# Patient Record
Sex: Female | Born: 1950 | Race: White | Hispanic: No | Marital: Married | Smoking: Never smoker
Health system: Southern US, Community
[De-identification: ages and names within clinical notes are randomized; demographics above are authoritative.]

## PROBLEM LIST (undated history)

## (undated) DIAGNOSIS — Z789 Other specified health status: Secondary | ICD-10-CM

## (undated) HISTORY — PX: DIAGNOSTIC LAPAROSCOPY: SUR761

## (undated) HISTORY — PX: BREAST SURGERY: SHX581

## (undated) HISTORY — PX: DILATION AND CURETTAGE OF UTERUS: SHX78

---

## 2000-01-01 ENCOUNTER — Other Ambulatory Visit: Admission: RE | Admit: 2000-01-01 | Discharge: 2000-01-01 | Payer: Self-pay | Admitting: Obstetrics and Gynecology

## 2000-02-21 ENCOUNTER — Ambulatory Visit (HOSPITAL_COMMUNITY): Admission: RE | Admit: 2000-02-21 | Discharge: 2000-02-21 | Payer: Self-pay | Admitting: Obstetrics and Gynecology

## 2000-11-17 ENCOUNTER — Encounter (INDEPENDENT_AMBULATORY_CARE_PROVIDER_SITE_OTHER): Payer: Self-pay | Admitting: Specialist

## 2000-11-17 ENCOUNTER — Other Ambulatory Visit: Admission: RE | Admit: 2000-11-17 | Discharge: 2000-11-17 | Payer: Self-pay | Admitting: Obstetrics and Gynecology

## 2001-01-02 ENCOUNTER — Ambulatory Visit (HOSPITAL_COMMUNITY): Admission: RE | Admit: 2001-01-02 | Discharge: 2001-01-02 | Payer: Self-pay | Admitting: Family Medicine

## 2001-01-02 ENCOUNTER — Encounter: Payer: Self-pay | Admitting: Family Medicine

## 2001-01-06 ENCOUNTER — Other Ambulatory Visit: Admission: RE | Admit: 2001-01-06 | Discharge: 2001-01-06 | Payer: Self-pay | Admitting: Obstetrics and Gynecology

## 2002-01-11 ENCOUNTER — Other Ambulatory Visit: Admission: RE | Admit: 2002-01-11 | Discharge: 2002-01-11 | Payer: Self-pay | Admitting: Obstetrics and Gynecology

## 2003-01-24 ENCOUNTER — Other Ambulatory Visit: Admission: RE | Admit: 2003-01-24 | Discharge: 2003-01-24 | Payer: Self-pay | Admitting: Obstetrics and Gynecology

## 2003-02-03 ENCOUNTER — Encounter: Admission: RE | Admit: 2003-02-03 | Discharge: 2003-02-03 | Payer: Self-pay | Admitting: Obstetrics and Gynecology

## 2003-02-03 ENCOUNTER — Encounter: Payer: Self-pay | Admitting: Obstetrics and Gynecology

## 2004-02-14 ENCOUNTER — Other Ambulatory Visit: Admission: RE | Admit: 2004-02-14 | Discharge: 2004-02-14 | Payer: Self-pay | Admitting: Obstetrics and Gynecology

## 2004-02-21 ENCOUNTER — Encounter: Admission: RE | Admit: 2004-02-21 | Discharge: 2004-02-21 | Payer: Self-pay | Admitting: Obstetrics and Gynecology

## 2004-05-23 ENCOUNTER — Encounter (INDEPENDENT_AMBULATORY_CARE_PROVIDER_SITE_OTHER): Payer: Self-pay | Admitting: *Deleted

## 2004-05-23 ENCOUNTER — Ambulatory Visit (HOSPITAL_BASED_OUTPATIENT_CLINIC_OR_DEPARTMENT_OTHER): Admission: RE | Admit: 2004-05-23 | Discharge: 2004-05-23 | Payer: Self-pay | Admitting: Obstetrics and Gynecology

## 2004-05-23 ENCOUNTER — Ambulatory Visit (HOSPITAL_COMMUNITY): Admission: RE | Admit: 2004-05-23 | Discharge: 2004-05-23 | Payer: Self-pay | Admitting: Obstetrics and Gynecology

## 2005-02-21 ENCOUNTER — Encounter: Admission: RE | Admit: 2005-02-21 | Discharge: 2005-02-21 | Payer: Self-pay | Admitting: Obstetrics and Gynecology

## 2006-02-25 ENCOUNTER — Encounter: Admission: RE | Admit: 2006-02-25 | Discharge: 2006-02-25 | Payer: Self-pay | Admitting: Obstetrics and Gynecology

## 2007-03-17 ENCOUNTER — Encounter: Admission: RE | Admit: 2007-03-17 | Discharge: 2007-03-17 | Payer: Self-pay | Admitting: Obstetrics and Gynecology

## 2007-03-26 ENCOUNTER — Encounter: Admission: RE | Admit: 2007-03-26 | Discharge: 2007-03-26 | Payer: Self-pay | Admitting: Obstetrics and Gynecology

## 2008-03-17 ENCOUNTER — Encounter: Admission: RE | Admit: 2008-03-17 | Discharge: 2008-03-17 | Payer: Self-pay | Admitting: Obstetrics and Gynecology

## 2008-03-31 ENCOUNTER — Encounter: Admission: RE | Admit: 2008-03-31 | Discharge: 2008-03-31 | Payer: Self-pay | Admitting: Obstetrics and Gynecology

## 2008-10-12 ENCOUNTER — Encounter: Admission: RE | Admit: 2008-10-12 | Discharge: 2008-10-12 | Payer: Self-pay | Admitting: Obstetrics and Gynecology

## 2009-03-23 ENCOUNTER — Encounter: Admission: RE | Admit: 2009-03-23 | Discharge: 2009-03-23 | Payer: Self-pay | Admitting: Obstetrics and Gynecology

## 2010-03-27 ENCOUNTER — Encounter: Admission: RE | Admit: 2010-03-27 | Discharge: 2010-03-27 | Payer: Self-pay | Admitting: Family Medicine

## 2011-01-24 ENCOUNTER — Other Ambulatory Visit: Payer: Self-pay | Admitting: Dermatology

## 2011-03-11 ENCOUNTER — Other Ambulatory Visit: Payer: Self-pay | Admitting: Obstetrics and Gynecology

## 2011-03-11 DIAGNOSIS — Z1231 Encounter for screening mammogram for malignant neoplasm of breast: Secondary | ICD-10-CM

## 2011-03-29 ENCOUNTER — Ambulatory Visit
Admission: RE | Admit: 2011-03-29 | Discharge: 2011-03-29 | Disposition: A | Discharge status: Home / Self Care | Payer: PRIVATE HEALTH INSURANCE | Source: Ambulatory Visit | Attending: Obstetrics and Gynecology | Admitting: Obstetrics and Gynecology

## 2011-03-29 DIAGNOSIS — Z1231 Encounter for screening mammogram for malignant neoplasm of breast: Secondary | ICD-10-CM

## 2011-05-03 NOTE — Op Note (Signed)
Lisa Oliver, Lisa Oliver                          ACCOUNT NO.:  [id number]   MEDICAL RECORD NO.:  [id number]                   PATIENT TYPE:  AMB   LOCATION:  [facility]                                 FACILITY:  [facility]   PHYSICIAN:  Sherry A. Rosalio Macadamia, M.D.           DATE OF BIRTH:  September 27, 1951   DATE OF PROCEDURE:  05/23/2004  DATE OF DISCHARGE:                                 OPERATIVE REPORT   PREOPERATIVE DIAGNOSES:  1. Postmenopausal bleeding.  2. Fibroid uterus.  3. Endometrial calcifications.   POSTOPERATIVE DIAGNOSES:  1. Postmenopausal bleeding.  2. Fibroid uterus.  3. Endometrial calcifications.  4. Midline septum.   PROCEDURE:  D&C hysteroscopy with resectoscope.   SURGEON:  Dr. Rosalio Macadamia   ANESTHESIA:  MAC.   INDICATIONS:  This is a 60 year old, G0, P0 woman, who has had persistent  postmenopausal bleeding.  This has been intermittent over several years.  It  would be present in small amounts frequently and then stopped for several  months and restart.  Because of this, an ultrasound was performed which  showed calcifications at the fundus of the uterus as well as multiple  fibroids within the uterus, one fibroid being at the fundus of the uterus  near the endometrial cavity and a hyperechoic area shadowing the cervix.  Because of this, the patient is brought to the operating room for [facility]  hysteroscopy with resectoscope.   FINDINGS:  Normal size, anteflexed uterus with endocervical polyp, midline  broad-based septum with some mucosal fibroid present next to the septum.   DESCRIPTION OF PROCEDURE:  The patient is brought into the operating room  and given adequate IV sedation.  She is placed in a dorsal lithotomy  position.  Her perineum was washed with Hibiclens.  Pelvic examination was  performed.  The patient was draped in a sterile fashion.  Speculum was  placed within the vagina.  Vagina was washed with Hibiclens.  Paracervical  block was administered with 1%  Nesacaine.  Anterior lip of the cervix was  grasped with a single-tooth tenaculum.  Cervix was sounded; cervix was  dilated with the [facility] dilators to a #31.  Hysteroscope was introduced into  the cervical canal.  Polypoid tissue was seen in the cervical canal, and  this was excised using the double-loop resector.  The cervix needed to be  dilated slightly more after this was removed, so the hysteroscope was  removed, speculum re-placed, and further dilatation performed.  The  hysteroscope was then re-placed, and visualization of the endometrial cavity  was then very easy to obtain.  The broad-base septum was difficult to  determine whether this was really separate from the submucosal fibroid.  This area was resected with obvious submucosal fibroid present.  The  submucosal fibroid was resected in sheets.  Endometrial tissue was resected  circumferentially from the endometrial cavity.  Small bleeders were  cauterized.  Pictures were obtained before and after surgery.  All  instruments were removed from the vagina after adequate hemostasis was  present.  The patient was taken out of the dorsal lithotomy position.  She  was awakened.  She was moved from the operating table to a stretcher in  stable condition.  Complications were none.  Estimated blood loss less than  10 mL.  Sorbitol differential minus 100 mL.   SPECIMENS:  1. Cervical polyp.  2. Endometrial resections.  3. Submucosal fibroid.                                               Sherry A. Rosalio Macadamia, M.D.    SAD/MEDQ  D:  05/23/2004  T:  05/23/2004  Job:  [id number]

## 2012-02-21 ENCOUNTER — Other Ambulatory Visit: Payer: Self-pay | Admitting: Obstetrics & Gynecology

## 2012-02-21 DIAGNOSIS — Z1231 Encounter for screening mammogram for malignant neoplasm of breast: Secondary | ICD-10-CM

## 2012-04-08 ENCOUNTER — Ambulatory Visit
Admission: RE | Admit: 2012-04-08 | Discharge: 2012-04-08 | Disposition: A | Discharge status: Home / Self Care | Payer: PRIVATE HEALTH INSURANCE | Source: Ambulatory Visit | Attending: Obstetrics & Gynecology | Admitting: Obstetrics & Gynecology

## 2012-04-08 DIAGNOSIS — Z1231 Encounter for screening mammogram for malignant neoplasm of breast: Secondary | ICD-10-CM

## 2013-03-01 ENCOUNTER — Other Ambulatory Visit: Payer: Self-pay

## 2013-04-09 ENCOUNTER — Ambulatory Visit
Admission: RE | Admit: 2013-04-09 | Discharge: 2013-04-09 | Disposition: A | Discharge status: Home / Self Care | Payer: PRIVATE HEALTH INSURANCE | Source: Ambulatory Visit

## 2013-04-09 DIAGNOSIS — Z1231 Encounter for screening mammogram for malignant neoplasm of breast: Secondary | ICD-10-CM

## 2013-11-02 ENCOUNTER — Other Ambulatory Visit: Payer: Self-pay | Admitting: Obstetrics & Gynecology

## 2013-11-02 DIAGNOSIS — N951 Menopausal and female climacteric states: Secondary | ICD-10-CM

## 2013-12-02 ENCOUNTER — Ambulatory Visit
Admission: RE | Admit: 2013-12-02 | Discharge: 2013-12-02 | Disposition: A | Discharge status: Home / Self Care | Payer: BC Managed Care – PPO | Source: Ambulatory Visit | Attending: Obstetrics & Gynecology | Admitting: Obstetrics & Gynecology

## 2013-12-02 DIAGNOSIS — N951 Menopausal and female climacteric states: Secondary | ICD-10-CM

## 2014-03-21 ENCOUNTER — Other Ambulatory Visit: Payer: Self-pay

## 2014-03-21 DIAGNOSIS — Z1231 Encounter for screening mammogram for malignant neoplasm of breast: Secondary | ICD-10-CM

## 2014-04-12 ENCOUNTER — Ambulatory Visit
Admission: RE | Admit: 2014-04-12 | Discharge: 2014-04-12 | Disposition: A | Discharge status: Home / Self Care | Payer: BC Managed Care – PPO | Source: Ambulatory Visit

## 2014-04-12 ENCOUNTER — Encounter (INDEPENDENT_AMBULATORY_CARE_PROVIDER_SITE_OTHER): Payer: Self-pay

## 2014-04-12 DIAGNOSIS — Z1231 Encounter for screening mammogram for malignant neoplasm of breast: Secondary | ICD-10-CM

## 2014-04-20 ENCOUNTER — Encounter: Payer: Self-pay | Admitting: Podiatry

## 2014-04-20 ENCOUNTER — Ambulatory Visit: Payer: BC Managed Care – PPO | Admitting: Podiatry

## 2014-04-20 VITALS — BP 117/73 | HR 73 | Resp 16

## 2014-04-20 DIAGNOSIS — M898X9 Other specified disorders of bone, unspecified site: Secondary | ICD-10-CM

## 2014-04-20 DIAGNOSIS — L600 Ingrowing nail: Secondary | ICD-10-CM

## 2014-04-20 NOTE — Progress Notes (Signed)
   Subjective:    Patient ID: Lisa Oliver, female    DOB: May 26, 1951, 63 y.o.   MRN: [id number]  HPI Comments: "I have this spot on the toe that is sore"  Patient c/o tender 5th toe right, lateral border, for several years. The toenail is split and the area is callused. She usually trims the corner and feels better. This time it got infected. Currently on cephalexin Rx'd by PCP.  Toe Pain       Review of Systems  All other systems reviewed and are negative.      Objective:   Physical Exam        Assessment & Plan:

## 2014-04-20 NOTE — Patient Instructions (Signed)

## 2014-04-21 NOTE — Progress Notes (Signed)
Subjective:     Patient ID: Lisa Oliver, female   DOB: 04/02/1951, 63 y.o.   MRN: [id number]  Toe Pain    patient presents with a painful fifth toe right foot outside stating that the nail has been split for years and has become increasingly tender over the last several months   Review of Systems  All other systems reviewed and are negative.      Objective:   Physical Exam  Nursing note and vitals reviewed. Constitutional: She is oriented to person, place, and time.  Cardiovascular: Intact distal pulses.   Musculoskeletal: Normal range of motion.  Neurological: She is oriented to person, place, and time.  Skin: Skin is warm.   neurovascular status is found to be intact with muscle strength adequate and range of motion of the subtalar and midtarsal joint within normal limits. Patient is found to have a rotated fifth toe right with a distal lateral side it's inflamed and sore with either nail disease or possibility of nail disease and keratotic lesion formation     Assessment:     Ingrown toenail deformity with possibility also for exostotic lesion secondary to digital position    Plan:     Educated patient on this and today we will focus on the nail. Infiltrated 60 mg Xylocaine Marcaine mixture remove the lateral border exposed matrix and apply chemical phenol 3 applications 30 seconds followed by alcohol lavaged and sterile dressing. Given instructions on soaks and if symptoms persist or lesion forms we will need to consider derotational arthroplasty which I explained

## 2014-04-25 ENCOUNTER — Telehealth: Payer: Self-pay | Admitting: *Deleted

## 2014-04-25 NOTE — Telephone Encounter (Signed)
I saw him last week for an ingrown toenail.  Dr. Paulla Dolly told me to soak it about 1 week.  What do I do next?  Leave a message, I may be at the vet taking care of a sick dog.  I attempted to return her call, line was busy

## 2014-04-27 NOTE — Telephone Encounter (Signed)
Patient called again today.  I returned her call and informed her she needs to do soaks as long as she has drainage as instructed.  I told her she can let it air out at night while relaxing but cover it back up when going to bed.  She asked how will she know if it's draining.  I told her to check her bandages, if nothing there can discontinue the soaks and dressings.  She said it's still a little swollen is this normal.  I informed her yes, it's normal.

## 2014-12-21 ENCOUNTER — Other Ambulatory Visit: Payer: Self-pay | Admitting: Obstetrics & Gynecology

## 2014-12-21 ENCOUNTER — Other Ambulatory Visit (HOSPITAL_COMMUNITY)
Admission: RE | Admit: 2014-12-21 | Discharge: 2014-12-21 | Disposition: A | Discharge status: Home / Self Care | Payer: Medicare Other | Source: Ambulatory Visit | Attending: Obstetrics & Gynecology | Admitting: Obstetrics & Gynecology

## 2014-12-21 DIAGNOSIS — Z01419 Encounter for gynecological examination (general) (routine) without abnormal findings: Secondary | ICD-10-CM | POA: Insufficient documentation

## 2014-12-21 DIAGNOSIS — Z1151 Encounter for screening for human papillomavirus (HPV): Secondary | ICD-10-CM | POA: Insufficient documentation

## 2014-12-23 LAB — CYTOLOGY - PAP

## 2015-01-12 ENCOUNTER — Other Ambulatory Visit: Payer: Self-pay | Admitting: Dermatology

## 2015-03-03 ENCOUNTER — Other Ambulatory Visit: Payer: Self-pay

## 2015-03-03 DIAGNOSIS — Z1239 Encounter for other screening for malignant neoplasm of breast: Secondary | ICD-10-CM

## 2015-04-18 ENCOUNTER — Ambulatory Visit
Admission: RE | Admit: 2015-04-18 | Discharge: 2015-04-18 | Disposition: A | Discharge status: Home / Self Care | Payer: Medicare Other | Source: Ambulatory Visit

## 2015-04-18 ENCOUNTER — Encounter (INDEPENDENT_AMBULATORY_CARE_PROVIDER_SITE_OTHER): Payer: Self-pay

## 2015-04-18 DIAGNOSIS — Z1239 Encounter for other screening for malignant neoplasm of breast: Secondary | ICD-10-CM

## 2016-02-22 ENCOUNTER — Other Ambulatory Visit: Payer: Self-pay

## 2016-02-22 DIAGNOSIS — Z1231 Encounter for screening mammogram for malignant neoplasm of breast: Secondary | ICD-10-CM

## 2016-04-23 ENCOUNTER — Ambulatory Visit: Payer: Medicare Other

## 2016-05-02 ENCOUNTER — Ambulatory Visit
Admission: RE | Admit: 2016-05-02 | Discharge: 2016-05-02 | Disposition: A | Discharge status: Home / Self Care | Payer: Medicare Other | Source: Ambulatory Visit

## 2016-05-02 DIAGNOSIS — Z1231 Encounter for screening mammogram for malignant neoplasm of breast: Secondary | ICD-10-CM

## 2017-01-09 ENCOUNTER — Other Ambulatory Visit: Payer: Self-pay | Admitting: Obstetrics & Gynecology

## 2017-01-09 DIAGNOSIS — Z1231 Encounter for screening mammogram for malignant neoplasm of breast: Secondary | ICD-10-CM

## 2017-01-17 ENCOUNTER — Other Ambulatory Visit: Payer: Self-pay | Admitting: Obstetrics & Gynecology

## 2017-01-17 DIAGNOSIS — N6310 Unspecified lump in the right breast, unspecified quadrant: Secondary | ICD-10-CM

## 2017-01-21 ENCOUNTER — Other Ambulatory Visit: Payer: Medicare Other

## 2017-01-27 ENCOUNTER — Ambulatory Visit
Admission: RE | Admit: 2017-01-27 | Discharge: 2017-01-27 | Disposition: A | Discharge status: Home / Self Care | Payer: Medicare Other | Source: Ambulatory Visit | Attending: Obstetrics & Gynecology | Admitting: Obstetrics & Gynecology

## 2017-01-27 ENCOUNTER — Other Ambulatory Visit: Payer: Self-pay | Admitting: Obstetrics & Gynecology

## 2017-01-27 DIAGNOSIS — N6310 Unspecified lump in the right breast, unspecified quadrant: Secondary | ICD-10-CM

## 2017-03-21 ENCOUNTER — Other Ambulatory Visit: Payer: Self-pay | Admitting: Obstetrics & Gynecology

## 2017-03-21 DIAGNOSIS — N63 Unspecified lump in unspecified breast: Secondary | ICD-10-CM

## 2017-05-07 ENCOUNTER — Ambulatory Visit: Payer: Medicare Other

## 2017-07-29 ENCOUNTER — Ambulatory Visit
Admission: RE | Admit: 2017-07-29 | Discharge: 2017-07-29 | Disposition: A | Discharge status: Home / Self Care | Payer: Medicare Other | Source: Ambulatory Visit | Attending: Obstetrics & Gynecology | Admitting: Obstetrics & Gynecology

## 2017-07-29 ENCOUNTER — Ambulatory Visit: Payer: Medicare Other

## 2017-07-29 DIAGNOSIS — N63 Unspecified lump in unspecified breast: Secondary | ICD-10-CM

## 2018-07-13 ENCOUNTER — Other Ambulatory Visit: Payer: Self-pay | Admitting: Family Medicine

## 2018-07-13 DIAGNOSIS — Z1231 Encounter for screening mammogram for malignant neoplasm of breast: Secondary | ICD-10-CM

## 2018-08-06 ENCOUNTER — Ambulatory Visit
Admission: RE | Admit: 2018-08-06 | Discharge: 2018-08-06 | Disposition: A | Discharge status: Home / Self Care | Payer: Medicare Other | Source: Ambulatory Visit | Attending: Family Medicine | Admitting: Family Medicine

## 2018-08-06 DIAGNOSIS — Z1231 Encounter for screening mammogram for malignant neoplasm of breast: Secondary | ICD-10-CM

## 2018-08-07 ENCOUNTER — Other Ambulatory Visit: Payer: Self-pay | Admitting: Family Medicine

## 2018-08-07 DIAGNOSIS — R928 Other abnormal and inconclusive findings on diagnostic imaging of breast: Secondary | ICD-10-CM

## 2018-08-12 ENCOUNTER — Other Ambulatory Visit: Payer: Self-pay | Admitting: Family Medicine

## 2018-08-12 ENCOUNTER — Ambulatory Visit
Admission: RE | Admit: 2018-08-12 | Discharge: 2018-08-12 | Disposition: A | Discharge status: Home / Self Care | Payer: Medicare Other | Source: Ambulatory Visit | Attending: Family Medicine | Admitting: Family Medicine

## 2018-08-12 DIAGNOSIS — N6489 Other specified disorders of breast: Secondary | ICD-10-CM

## 2018-08-12 DIAGNOSIS — R928 Other abnormal and inconclusive findings on diagnostic imaging of breast: Secondary | ICD-10-CM

## 2018-08-13 ENCOUNTER — Ambulatory Visit
Admission: RE | Admit: 2018-08-13 | Discharge: 2018-08-13 | Disposition: A | Discharge status: Home / Self Care | Payer: Medicare Other | Source: Ambulatory Visit | Attending: Family Medicine | Admitting: Family Medicine

## 2018-08-13 DIAGNOSIS — N6489 Other specified disorders of breast: Secondary | ICD-10-CM

## 2018-08-16 HISTORY — PX: BREAST BIOPSY: SHX20

## 2018-08-25 ENCOUNTER — Ambulatory Visit: Payer: Self-pay | Admitting: Surgery

## 2018-08-25 ENCOUNTER — Other Ambulatory Visit: Payer: Self-pay | Admitting: Surgery

## 2018-08-25 DIAGNOSIS — N6320 Unspecified lump in the left breast, unspecified quadrant: Secondary | ICD-10-CM

## 2018-08-25 NOTE — H&P (Signed)
Lisa Oliver Documented: 08/25/2018 10:29 AM Location: [facility] Patient #: [id number] DOB: 10/24/1951 Married / Language: Lisa Oliver / Race: White Female  History of Present Illness [name] A. [name] MD; 08/25/2018 11:00 AM) Patient words: CLINICAL DATA: 67 year old female recalled from screening mammogram dated 08/06/2018 for possible left breast distortion with associated mass.  EXAM: DIGITAL DIAGNOSTIC LEFT MAMMOGRAM WITH CAD AND TOMO  ULTRASOUND LEFT BREAST  COMPARISON: Previous exam(s).  ACR Breast Density Category b: There are scattered areas of fibroglandular density.  FINDINGS: An area of focal distortion persists in the upper outer quadrant of the left breast at posterior depth. This is best seen on the MLO projection. No definite associated mass is seen on today's additional views. A second area of questionable distortion slightly more medial on the CC projection does not persist on additional spot       Patient sent at the request of Dr. Owens Shark due to abnormal left breast mammogram. The patient went for screening mammogram and subsequent diagnostic mammogram and ultrasound. There is a vague density left breast upper quadrant core biopsy proven to be a complex sclerosing lesion. The patient denies any history of breast mastectomy discharge or breast pain. She's had previous callbacks past on her mammograms but this is her first biopsy. She is sore bruise from her biopsy but otherwise has no complaint            compression views. Further evaluation with ultrasound was performed.  Mammographic images were processed with CAD.  Targeted ultrasound is performed, showing no definite focal findings in the upper-outer quadrant of the left breast. Evaluation of the left axilla demonstrates no suspicious adenopathy.  IMPRESSION: 1. Persistent left breast distortion without ultrasound correlate. Recommendation is for stereotactic biopsy. Of note,  the finding is best seen on the MLO projection. 2. No suspicious left axillary lymphadenopathy.  RECOMMENDATION: Stereotactic biopsy of left breast distortion.  I have discussed the findings and recommendations with the patient. Results were also provided in writing at the conclusion of the visit. If applicable, a reminder letter will be sent to the patient regarding the next appointment.  BI-RADS CATEGORY 4: Suspicious.   Electronically Signed By: Kristopher Oppenheim M.D. 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 - SEE COMMENT.  The patient is a 67 year old female.   Past Surgical History ([name], [place]; 08/25/2018 10:29 AM) Breast Biopsy Left.  Diagnostic Studies History ([name], [place]; 08/25/2018 10:29 AM) Colonoscopy 5-10 years ago Mammogram within last year Pap Smear 1-5 years ago  Allergies ([name], [place]; 08/25/2018 10:30 AM) No Known Drug Allergies [08/25/2018]: Allergies Reconciled  Medication History ([name], RMA; 08/25/2018 10:30 AM) Levothyroxine Sodium (112MCG Tablet, Oral) Active. metroNIDAZOLE (1% Gel, External) Active. Medications Reconciled  Social History ([name], [place]; 08/25/2018 10:29 AM) Alcohol use Moderate alcohol use. Caffeine use Carbonated beverages, Coffee, Tea. No drug use Tobacco use Never smoker.  Family History ([name], [place]; 08/25/2018 10:29 AM) Arthritis Father, Mother. Cerebrovascular Accident Mother, Sister. Colon Polyps Father. Heart Disease Mother. Malignant Neoplasm Of Pancreas Family Members In General.  Pregnancy / Birth History ([name], [place]; 08/25/2018 10:29 AM) Age at menarche 1 years. Age of menopause 51-55 Contraceptive History Oral contraceptives. Para 0  Other Problems (Tanisha A.  Owens Shark, [place]; 08/25/2018 10:29 AM) Thyroid Disease  Review of Systems (Tora Prunty A. Kiriana Worthington MD; 08/25/2018 11:01 AM) General Not Present- Appetite Loss, Chills, Fatigue, Fever, Night Sweats, Weight Gain and Weight Loss. Skin Not Present- Change in Wart/Mole, Dryness, Hives, Jaundice, New Lesions, Non-Healing Wounds, Rash and Ulcer. HEENT Present- Seasonal Allergies. Not Present- Earache, Hearing Loss, Hoarseness, Nose Bleed, Oral Ulcers, Ringing in the Ears, Sinus Pain, Sore Throat, Visual Disturbances, Wears glasses/contact lenses and Yellow Eyes. Respiratory Not Present- Bloody sputum, Chronic Cough, Difficulty Breathing, Snoring and Wheezing. Breast Not Present- Breast Mass, Breast Pain, Nipple Discharge and Skin Changes. Cardiovascular Not Present- Chest Pain, Difficulty Breathing Lying Down, Leg Cramps, Palpitations, Rapid Heart Rate, Shortness of Breath and Swelling of Extremities. Gastrointestinal Not Present- Abdominal Pain, Bloating, Bloody Stool, Change in Bowel Habits, Chronic diarrhea, Constipation, Difficulty Swallowing, Excessive gas, Gets full quickly at meals, Hemorrhoids, Indigestion, Nausea, Rectal Pain and Vomiting. Female Genitourinary Not Present- Frequency, Nocturia, Painful Urination, Pelvic Pain and Urgency. Musculoskeletal Not Present- Back Pain, Joint Pain, Joint Stiffness, Muscle Pain, Muscle Weakness and Swelling of Extremities. Neurological Not Present- Decreased Memory, Fainting, Headaches, Numbness, Seizures, Tingling, Tremor, Trouble walking and Weakness. Psychiatric Not Present- Anxiety, Bipolar, Change in Sleep Pattern, Depression, Fearful and Frequent crying. Endocrine Not Present- Cold Intolerance, Excessive Hunger, Hair Changes, Heat Intolerance, Hot flashes and New Diabetes. Hematology Not Present- Blood Thinners, Easy Bruising, Excessive bleeding, Gland problems, HIV and Persistent Infections. All other systems negative  Vitals (Tanisha A. Brown RMA;  08/25/2018 10:30 AM) 08/25/2018 10:29 AM Weight: 208.8 lb Height: 69in Body Surface Area: 2.1 m Body Mass Index: 30.83 kg/m  Temp.: 98.54F  BP: 132/86 (Sitting, Left Arm, Standard)      Physical Exam (Daevon Holdren A. Chaniqua Brisby MD; 08/25/2018 11:01 AM)  General Mental Status-Alert. General Appearance-Consistent with stated age. Hydration-Well hydrated. Voice-Normal.  Head and Neck Head-normocephalic, atraumatic with no lesions or palpable masses. Trachea-midline. Thyroid Gland Characteristics - normal size and consistency.  Eye Eyeball - Bilateral-Extraocular movements intact. Sclera/Conjunctiva - Bilateral-No scleral icterus.  Chest and Lung Exam Chest and lung exam reveals -quiet, even and easy respiratory effort with no use of accessory muscles and on auscultation, normal breath sounds, no adventitious sounds and normal vocal resonance. Inspection Chest Wall - Normal. Back - normal.  Breast Note: Bruising left breast upper outer quadrant noted. No mass. Otherwise no masses or evidence of nipple discharge bilaterally.  Cardiovascular Cardiovascular examination reveals -normal heart sounds, regular rate and rhythm with no murmurs and normal pedal pulses bilaterally.  Neurologic Neurologic evaluation reveals -alert and oriented x 3 with no impairment of recent or remote memory. Mental Status-Normal.  Musculoskeletal Normal Exam - Left-Upper Extremity Strength Normal and Lower Extremity Strength Normal. Normal Exam - Right-Upper Extremity Strength Normal and Lower Extremity Strength Normal.  Lymphatic Head & Neck  General Head & Neck Lymphatics: Bilateral - Description - Normal. Axillary  General Axillary Region: Bilateral - Description - Normal. Tenderness - Non Tender.    Assessment & Plan (Jonnie Kubly A. Fritz Cauthon MD; 08/25/2018 10:59 AM)  MASS OF LEFT BREAST ON MAMMOGRAM (N63.20) Impression: Risk of lumpectomy include bleeding,  infection, seroma, more surgery, use of seed/wire, wound care, cosmetic deformity and the need for other treatments, death , blood clots, death. Pt agrees to proceed. CSL on core  Discussed observation versus left breast lumpectomy. Risks of malignancey 10% or less. She has opted for left breast lumpectomy. Nonoperative follow-up also discussed as well as another option.  Current Plans The anatomy and the physiology was discussed. The pathophysiology and natural history of  the disease was discussed. Options were discussed and recommendations were made. Technique, risks, benefits, & alternatives were discussed. Risks such as cosmetic deformity seroma infection bleeeding and the need for more surgerystroke, heart attack, bleeding, indection, death, and other risks discussed. Questions answered. The patient agrees to proceed. Pt Education - [facility] Breast Biopsy HCI: discussed with patient and provided information. You are being scheduled for surgery- Our schedulers will call you.  You should hear from our office's scheduling department within 5 working days about the location, date, and time of surgery. We try to make accommodations for patient's preferences in scheduling surgery, but sometimes the OR schedule or the surgeon's schedule prevents [place] from making those accommodations.  If you have not heard from our office [phone]) in 5 working days, call the office and ask for your surgeon's nurse.  If you have other questions about your diagnosis, plan, or surgery, call the office and ask for your surgeon's nurse.

## 2018-08-25 NOTE — H&P (View-Only) (Signed)
Derry Skill Documented: 08/25/2018 10:29 AM Location: [facility] Patient #: [id number] DOB: Jan 28, 1951 Married / Language: [name] / Race: White Female  History of Present Illness [name] A. Aaira Oestreicher MD; 08/25/2018 11:00 AM) Patient words: CLINICAL DATA: 67 year old female recalled from screening mammogram dated 08/06/2018 for possible left breast distortion with associated mass.  EXAM: DIGITAL DIAGNOSTIC LEFT MAMMOGRAM WITH CAD AND TOMO  ULTRASOUND LEFT BREAST  COMPARISON: Previous exam(s).  ACR Breast Density Category b: There are scattered areas of fibroglandular density.  FINDINGS: An area of focal distortion persists in the upper outer quadrant of the left breast at posterior depth. This is best seen on the MLO projection. No definite associated mass is seen on today's additional views. A second area of questionable distortion slightly more medial on the CC projection does not persist on additional spot       Patient sent at the request of Dr. Owens Shark due to abnormal left breast mammogram. The patient went for screening mammogram and subsequent diagnostic mammogram and ultrasound. There is a vague density left breast upper quadrant core biopsy proven to be a complex sclerosing lesion. The patient denies any history of breast mastectomy discharge or breast pain. She's had previous callbacks past on her mammograms but this is her first biopsy. She is sore bruise from her biopsy but otherwise has no complaint            compression views. Further evaluation with ultrasound was performed.  Mammographic images were processed with CAD.  Targeted ultrasound is performed, showing no definite focal findings in the upper-outer quadrant of the left breast. Evaluation of the left axilla demonstrates no suspicious adenopathy.  IMPRESSION: 1. Persistent left breast distortion without ultrasound correlate. Recommendation is for stereotactic biopsy. Of note,  the finding is best seen on the MLO projection. 2. No suspicious left axillary lymphadenopathy.  RECOMMENDATION: Stereotactic biopsy of left breast distortion.  I have discussed the findings and recommendations with the patient. Results were also provided in writing at the conclusion of the visit. If applicable, a reminder letter will be sent to the patient regarding the next appointment.  BI-RADS CATEGORY 4: Suspicious.   Electronically Signed By: Kristopher Oppenheim M.D. 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 - SEE COMMENT.  The patient is a 67 year old female.   Past Surgical History ([name], [place]; 08/25/2018 10:29 AM) Breast Biopsy Left.  Diagnostic Studies History ([name], [place]; 08/25/2018 10:29 AM) Colonoscopy 5-10 years ago Mammogram within last year Pap Smear 1-5 years ago  Allergies ([name], [place]; 08/25/2018 10:30 AM) No Known Drug Allergies [08/25/2018]: Allergies Reconciled  Medication History ([name], RMA; 08/25/2018 10:30 AM) Levothyroxine Sodium (112MCG Tablet, Oral) Active. metroNIDAZOLE (1% Gel, External) Active. Medications Reconciled  Social History ([name], [place]; 08/25/2018 10:29 AM) Alcohol use Moderate alcohol use. Caffeine use Carbonated beverages, Coffee, Tea. No drug use Tobacco use Never smoker.  Family History ([name], [place]; 08/25/2018 10:29 AM) Arthritis Father, Mother. Cerebrovascular Accident Mother, Sister. Colon Polyps Father. Heart Disease Mother. Malignant Neoplasm Of Pancreas Family Members In General.  Pregnancy / Birth History ([name], [place]; 08/25/2018 10:29 AM) Age at menarche 59 years. Age of menopause 51-55 Contraceptive History Oral contraceptives. Para 0  Other Problems (Tanisha A.  Owens Shark, [place]; 08/25/2018 10:29 AM) Thyroid Disease  Review of Systems (Sharetha Newson A. Mckennah Kretchmer MD; 08/25/2018 11:01 AM) General Not Present- Appetite Loss, Chills, Fatigue, Fever, Night Sweats, Weight Gain and Weight Loss. Skin Not Present- Change in Wart/Mole, Dryness, Hives, Jaundice, New Lesions, Non-Healing Wounds, Rash and Ulcer. HEENT Present- Seasonal Allergies. Not Present- Earache, Hearing Loss, Hoarseness, Nose Bleed, Oral Ulcers, Ringing in the Ears, Sinus Pain, Sore Throat, Visual Disturbances, Wears glasses/contact lenses and Yellow Eyes. Respiratory Not Present- Bloody sputum, Chronic Cough, Difficulty Breathing, Snoring and Wheezing. Breast Not Present- Breast Mass, Breast Pain, Nipple Discharge and Skin Changes. Cardiovascular Not Present- Chest Pain, Difficulty Breathing Lying Down, Leg Cramps, Palpitations, Rapid Heart Rate, Shortness of Breath and Swelling of Extremities. Gastrointestinal Not Present- Abdominal Pain, Bloating, Bloody Stool, Change in Bowel Habits, Chronic diarrhea, Constipation, Difficulty Swallowing, Excessive gas, Gets full quickly at meals, Hemorrhoids, Indigestion, Nausea, Rectal Pain and Vomiting. Female Genitourinary Not Present- Frequency, Nocturia, Painful Urination, Pelvic Pain and Urgency. Musculoskeletal Not Present- Back Pain, Joint Pain, Joint Stiffness, Muscle Pain, Muscle Weakness and Swelling of Extremities. Neurological Not Present- Decreased Memory, Fainting, Headaches, Numbness, Seizures, Tingling, Tremor, Trouble walking and Weakness. Psychiatric Not Present- Anxiety, Bipolar, Change in Sleep Pattern, Depression, Fearful and Frequent crying. Endocrine Not Present- Cold Intolerance, Excessive Hunger, Hair Changes, Heat Intolerance, Hot flashes and New Diabetes. Hematology Not Present- Blood Thinners, Easy Bruising, Excessive bleeding, Gland problems, HIV and Persistent Infections. All other systems negative  Vitals (Tanisha A. Brown RMA;  08/25/2018 10:30 AM) 08/25/2018 10:29 AM Weight: 208.8 lb Height: 69in Body Surface Area: 2.1 m Body Mass Index: 30.83 kg/m  Temp.: 98.88F  BP: 132/86 (Sitting, Left Arm, Standard)      Physical Exam (Naquita Nappier A. Lean Jaeger MD; 08/25/2018 11:01 AM)  General Mental Status-Alert. General Appearance-Consistent with stated age. Hydration-Well hydrated. Voice-Normal.  Head and Neck Head-normocephalic, atraumatic with no lesions or palpable masses. Trachea-midline. Thyroid Gland Characteristics - normal size and consistency.  Eye Eyeball - Bilateral-Extraocular movements intact. Sclera/Conjunctiva - Bilateral-No scleral icterus.  Chest and Lung Exam Chest and lung exam reveals -quiet, even and easy respiratory effort with no use of accessory muscles and on auscultation, normal breath sounds, no adventitious sounds and normal vocal resonance. Inspection Chest Wall - Normal. Back - normal.  Breast Note: Bruising left breast upper outer quadrant noted. No mass. Otherwise no masses or evidence of nipple discharge bilaterally.  Cardiovascular Cardiovascular examination reveals -normal heart sounds, regular rate and rhythm with no murmurs and normal pedal pulses bilaterally.  Neurologic Neurologic evaluation reveals -alert and oriented x 3 with no impairment of recent or remote memory. Mental Status-Normal.  Musculoskeletal Normal Exam - Left-Upper Extremity Strength Normal and Lower Extremity Strength Normal. Normal Exam - Right-Upper Extremity Strength Normal and Lower Extremity Strength Normal.  Lymphatic Head & Neck  General Head & Neck Lymphatics: Bilateral - Description - Normal. Axillary  General Axillary Region: Bilateral - Description - Normal. Tenderness - Non Tender.    Assessment & Plan (Abriel Geesey A. Truth Barot MD; 08/25/2018 10:59 AM)  MASS OF LEFT BREAST ON MAMMOGRAM (N63.20) Impression: Risk of lumpectomy include bleeding,  infection, seroma, more surgery, use of seed/wire, wound care, cosmetic deformity and the need for other treatments, death , blood clots, death. Pt agrees to proceed. CSL on core  Discussed observation versus left breast lumpectomy. Risks of malignancey 10% or less. She has opted for left breast lumpectomy. Nonoperative follow-up also discussed as well as another option.  Current Plans The anatomy and the physiology was discussed. The pathophysiology and natural history of  the disease was discussed. Options were discussed and recommendations were made. Technique, risks, benefits, & alternatives were discussed. Risks such as cosmetic deformity seroma infection bleeeding and the need for more surgerystroke, heart attack, bleeding, indection, death, and other risks discussed. Questions answered. The patient agrees to proceed. Pt Education - [facility] Breast Biopsy HCI: discussed with patient and provided information. You are being scheduled for surgery- Our schedulers will call you.  You should hear from our office's scheduling department within 5 working days about the location, date, and time of surgery. We try to make accommodations for patient's preferences in scheduling surgery, but sometimes the OR schedule or the surgeon's schedule prevents [place] from making those accommodations.  If you have not heard from our office [phone]) in 5 working days, call the office and ask for your surgeon's nurse.  If you have other questions about your diagnosis, plan, or surgery, call the office and ask for your surgeon's nurse.

## 2018-09-02 ENCOUNTER — Other Ambulatory Visit: Payer: Self-pay

## 2018-09-02 ENCOUNTER — Encounter (HOSPITAL_BASED_OUTPATIENT_CLINIC_OR_DEPARTMENT_OTHER): Payer: Self-pay | Admitting: *Deleted

## 2018-09-09 ENCOUNTER — Encounter (HOSPITAL_BASED_OUTPATIENT_CLINIC_OR_DEPARTMENT_OTHER)
Admission: RE | Admit: 2018-09-09 | Discharge: 2018-09-09 | Disposition: A | Discharge status: Home / Self Care | Payer: Medicare Other | Source: Ambulatory Visit | Attending: Surgery | Admitting: Surgery

## 2018-09-09 ENCOUNTER — Ambulatory Visit
Admission: RE | Admit: 2018-09-09 | Discharge: 2018-09-09 | Disposition: A | Discharge status: Home / Self Care | Payer: Medicare Other | Source: Ambulatory Visit | Attending: Surgery | Admitting: Surgery

## 2018-09-09 DIAGNOSIS — E669 Obesity, unspecified: Secondary | ICD-10-CM | POA: Diagnosis not present

## 2018-09-09 DIAGNOSIS — E039 Hypothyroidism, unspecified: Secondary | ICD-10-CM | POA: Diagnosis not present

## 2018-09-09 DIAGNOSIS — Z6830 Body mass index (BMI) 30.0-30.9, adult: Secondary | ICD-10-CM | POA: Diagnosis not present

## 2018-09-09 DIAGNOSIS — Z79890 Hormone replacement therapy (postmenopausal): Secondary | ICD-10-CM | POA: Diagnosis not present

## 2018-09-09 DIAGNOSIS — N6320 Unspecified lump in the left breast, unspecified quadrant: Secondary | ICD-10-CM

## 2018-09-09 DIAGNOSIS — N6092 Unspecified benign mammary dysplasia of left breast: Secondary | ICD-10-CM | POA: Diagnosis not present

## 2018-09-09 DIAGNOSIS — N6489 Other specified disorders of breast: Secondary | ICD-10-CM | POA: Diagnosis not present

## 2018-09-09 LAB — COMPREHENSIVE METABOLIC PANEL
ALK PHOS: 54 U/L (ref 38–126)
ALT: 19 U/L (ref 0–44)
AST: 27 U/L (ref 15–41)
Albumin: 3.8 g/dL (ref 3.5–5.0)
Anion gap: 11 (ref 5–15)
BUN: 14 mg/dL (ref 8–23)
CALCIUM: 9.4 mg/dL (ref 8.9–10.3)
CO2: 23 mmol/L (ref 22–32)
CREATININE: 0.82 mg/dL (ref 0.44–1.00)
Chloride: 101 mmol/L (ref 98–111)
GFR calc Af Amer: >60 mL/min (ref >60)
GFR calc non Af Amer: >60 mL/min (ref >60)
Glucose, Bld: 111 mg/dL — ABNORMAL HIGH (ref 70–99)
Potassium: 4.7 mmol/L (ref 3.5–5.1)
SODIUM: 135 mmol/L (ref 135–145)
Total Bilirubin: 0.7 mg/dL (ref 0.3–1.2)
Total Protein: 6.5 g/dL (ref 6.5–8.1)

## 2018-09-09 LAB — CBC WITH DIFFERENTIAL/PLATELET
Abs Immature Granulocytes: 0 10*3/uL (ref 0.0–0.1)
Basophils Absolute: 0 10*3/uL (ref 0.0–0.1)
Basophils Relative: 0 %
Eosinophils Absolute: 0.2 10*3/uL (ref 0.0–0.7)
Eosinophils Relative: 3 %
HCT: 42.1 % (ref 36.0–46.0)
HEMOGLOBIN: 13.7 g/dL (ref 12.0–15.0)
IMMATURE GRANULOCYTES: 0 %
LYMPHS PCT: 24 %
Lymphs Abs: 1.5 10*3/uL (ref 0.7–4.0)
MCH: 28.4 pg (ref 26.0–34.0)
MCHC: 32.5 g/dL (ref 30.0–36.0)
MCV: 87.2 fL (ref 78.0–100.0)
MONO ABS: 0.6 10*3/uL (ref 0.1–1.0)
MONOS PCT: 10 %
Neutro Abs: 3.9 10*3/uL (ref 1.7–7.7)
Neutrophils Relative %: 63 %
Platelets: 298 10*3/uL (ref 150–400)
RBC: 4.83 MIL/uL (ref 3.87–5.11)
RDW: 14.3 % (ref 11.5–15.5)
WBC: 6.2 10*3/uL (ref 4.0–10.5)

## 2018-09-09 NOTE — Progress Notes (Signed)
Ensure pre surgery drink given with instructions to complete by 0445 dos, pt verbalized understanding. 

## 2018-09-10 ENCOUNTER — Ambulatory Visit (HOSPITAL_BASED_OUTPATIENT_CLINIC_OR_DEPARTMENT_OTHER): Payer: Medicare Other | Admitting: Anesthesiology

## 2018-09-10 ENCOUNTER — Encounter (HOSPITAL_BASED_OUTPATIENT_CLINIC_OR_DEPARTMENT_OTHER): Payer: Self-pay | Admitting: Anesthesiology

## 2018-09-10 ENCOUNTER — Encounter (HOSPITAL_BASED_OUTPATIENT_CLINIC_OR_DEPARTMENT_OTHER)
Admission: RE | Disposition: A | Discharge status: Home / Self Care | Payer: Self-pay | Source: Ambulatory Visit | Attending: Surgery

## 2018-09-10 ENCOUNTER — Ambulatory Visit
Admission: RE | Admit: 2018-09-10 | Discharge: 2018-09-10 | Disposition: A | Discharge status: Home / Self Care | Payer: Medicare Other | Source: Ambulatory Visit | Attending: Surgery | Admitting: Surgery

## 2018-09-10 ENCOUNTER — Other Ambulatory Visit: Payer: Self-pay

## 2018-09-10 ENCOUNTER — Ambulatory Visit (HOSPITAL_BASED_OUTPATIENT_CLINIC_OR_DEPARTMENT_OTHER)
Admission: RE | Admit: 2018-09-10 | Discharge: 2018-09-10 | Disposition: A | Discharge status: Home / Self Care | Payer: Medicare Other | Source: Ambulatory Visit | Attending: Surgery | Admitting: Surgery

## 2018-09-10 DIAGNOSIS — E669 Obesity, unspecified: Secondary | ICD-10-CM | POA: Diagnosis not present

## 2018-09-10 DIAGNOSIS — N6320 Unspecified lump in the left breast, unspecified quadrant: Secondary | ICD-10-CM

## 2018-09-10 DIAGNOSIS — E039 Hypothyroidism, unspecified: Secondary | ICD-10-CM | POA: Insufficient documentation

## 2018-09-10 DIAGNOSIS — N6092 Unspecified benign mammary dysplasia of left breast: Secondary | ICD-10-CM | POA: Diagnosis not present

## 2018-09-10 DIAGNOSIS — Z79890 Hormone replacement therapy (postmenopausal): Secondary | ICD-10-CM | POA: Insufficient documentation

## 2018-09-10 DIAGNOSIS — Z6830 Body mass index (BMI) 30.0-30.9, adult: Secondary | ICD-10-CM | POA: Insufficient documentation

## 2018-09-10 DIAGNOSIS — N6489 Other specified disorders of breast: Secondary | ICD-10-CM | POA: Diagnosis not present

## 2018-09-10 HISTORY — PX: BREAST LUMPECTOMY WITH RADIOACTIVE SEED LOCALIZATION: SHX6424

## 2018-09-10 HISTORY — DX: Other specified health status: Z78.9

## 2018-09-10 SURGERY — BREAST LUMPECTOMY WITH RADIOACTIVE SEED LOCALIZATION
Anesthesia: General | Site: Breast | Laterality: Left

## 2018-09-10 MED ORDER — CHLORHEXIDINE GLUCONATE CLOTH 2 % EX PADS: 6.0000 | MEDICATED_PAD | Freq: Once | CUTANEOUS | Status: DC

## 2018-09-10 MED ORDER — GLYCOPYRROLATE PF 0.2 MG/ML IJ SOSY
PREFILLED_SYRINGE | INTRAMUSCULAR | Status: AC
  Filled 2018-09-10: qty 2

## 2018-09-10 MED ORDER — DEXAMETHASONE SODIUM PHOSPHATE 10 MG/ML IJ SOLN
INTRAMUSCULAR | Status: AC
  Filled 2018-09-10: qty 1

## 2018-09-10 MED ORDER — MIDAZOLAM HCL 2 MG/2ML IJ SOLN
INTRAMUSCULAR | Status: AC
  Filled 2018-09-10: qty 2

## 2018-09-10 MED ORDER — LIDOCAINE HCL (CARDIAC) PF 100 MG/5ML IV SOSY
PREFILLED_SYRINGE | INTRAVENOUS | Status: DC | PRN
  Administered 2018-09-10: 50 mg via INTRAVENOUS

## 2018-09-10 MED ORDER — LIDOCAINE 2% (20 MG/ML) 5 ML SYRINGE
INTRAMUSCULAR | Status: AC
  Filled 2018-09-10: qty 5

## 2018-09-10 MED ORDER — GABAPENTIN 300 MG PO CAPS
ORAL_CAPSULE | ORAL | Status: AC
  Filled 2018-09-10: qty 1

## 2018-09-10 MED ORDER — ACETAMINOPHEN 500 MG PO TABS
ORAL_TABLET | ORAL | Status: AC
  Filled 2018-09-10: qty 2

## 2018-09-10 MED ORDER — MIDAZOLAM HCL 2 MG/2ML IJ SOLN: 1.0000 mg | INTRAMUSCULAR | Status: DC | PRN

## 2018-09-10 MED ORDER — ONDANSETRON HCL 4 MG/2ML IJ SOLN: 4.0000 mg | Freq: Once | INTRAMUSCULAR | Status: DC | PRN

## 2018-09-10 MED ORDER — PHENYLEPHRINE HCL 10 MG/ML IJ SOLN
INTRAMUSCULAR | Status: DC | PRN
  Administered 2018-09-10: 40 ug via INTRAVENOUS
  Administered 2018-09-10: 80 ug via INTRAVENOUS

## 2018-09-10 MED ORDER — CEFAZOLIN SODIUM-DEXTROSE 2-4 GM/100ML-% IV SOLN
2.0000 g | INTRAVENOUS | Status: AC
  Administered 2018-09-10: 2 g via INTRAVENOUS

## 2018-09-10 MED ORDER — EPHEDRINE 5 MG/ML INJ
INTRAVENOUS | Status: AC
  Filled 2018-09-10: qty 10

## 2018-09-10 MED ORDER — CELECOXIB 200 MG PO CAPS
ORAL_CAPSULE | ORAL | Status: AC
  Filled 2018-09-10: qty 1

## 2018-09-10 MED ORDER — SCOPOLAMINE 1 MG/3DAYS TD PT72: 1.0000 | MEDICATED_PATCH | Freq: Once | TRANSDERMAL | Status: DC | PRN

## 2018-09-10 MED ORDER — ONDANSETRON HCL 4 MG/2ML IJ SOLN
INTRAMUSCULAR | Status: AC
  Filled 2018-09-10: qty 2

## 2018-09-10 MED ORDER — PROPOFOL 10 MG/ML IV BOLUS
INTRAVENOUS | Status: DC | PRN
  Administered 2018-09-10: 180 mg via INTRAVENOUS

## 2018-09-10 MED ORDER — DEXAMETHASONE SODIUM PHOSPHATE 4 MG/ML IJ SOLN
INTRAMUSCULAR | Status: DC | PRN
  Administered 2018-09-10: 10 mg via INTRAVENOUS

## 2018-09-10 MED ORDER — BUPIVACAINE-EPINEPHRINE (PF) 0.25% -1:200000 IJ SOLN
INTRAMUSCULAR | Status: DC | PRN
  Administered 2018-09-10: 20 mL

## 2018-09-10 MED ORDER — CELECOXIB 200 MG PO CAPS
200.0000 mg | ORAL_CAPSULE | ORAL | Status: AC
  Administered 2018-09-10: 200 mg via ORAL

## 2018-09-10 MED ORDER — PHENYLEPHRINE 40 MCG/ML (10ML) SYRINGE FOR IV PUSH (FOR BLOOD PRESSURE SUPPORT)
PREFILLED_SYRINGE | INTRAVENOUS | Status: AC
  Filled 2018-09-10: qty 10

## 2018-09-10 MED ORDER — FENTANYL CITRATE (PF) 100 MCG/2ML IJ SOLN
INTRAMUSCULAR | Status: DC | PRN
  Administered 2018-09-10: 50 ug via INTRAVENOUS

## 2018-09-10 MED ORDER — ACETAMINOPHEN 500 MG PO TABS
1000.0000 mg | ORAL_TABLET | ORAL | Status: AC
  Administered 2018-09-10: 1000 mg via ORAL

## 2018-09-10 MED ORDER — FENTANYL CITRATE (PF) 100 MCG/2ML IJ SOLN: 25.0000 ug | INTRAMUSCULAR | Status: DC | PRN

## 2018-09-10 MED ORDER — EPHEDRINE SULFATE 50 MG/ML IJ SOLN
INTRAMUSCULAR | Status: DC | PRN
  Administered 2018-09-10: 5 mg via INTRAVENOUS
  Administered 2018-09-10: 10 mg via INTRAVENOUS
  Administered 2018-09-10: 15 mg via INTRAVENOUS
  Administered 2018-09-10: 5 mg via INTRAVENOUS

## 2018-09-10 MED ORDER — FENTANYL CITRATE (PF) 100 MCG/2ML IJ SOLN: 50.0000 ug | INTRAMUSCULAR | Status: DC | PRN

## 2018-09-10 MED ORDER — HYDROCODONE-ACETAMINOPHEN 5-325 MG PO TABS: 1.0000 | ORAL_TABLET | Freq: Four times a day (QID) | ORAL | 0 refills | Status: DC | PRN

## 2018-09-10 MED ORDER — LACTATED RINGERS IV SOLN
INTRAVENOUS | Status: DC
  Administered 2018-09-10 (×2): via INTRAVENOUS

## 2018-09-10 MED ORDER — FENTANYL CITRATE (PF) 100 MCG/2ML IJ SOLN
INTRAMUSCULAR | Status: AC
  Filled 2018-09-10: qty 2

## 2018-09-10 MED ORDER — GABAPENTIN 300 MG PO CAPS
300.0000 mg | ORAL_CAPSULE | ORAL | Status: AC
  Administered 2018-09-10: 300 mg via ORAL

## 2018-09-10 MED ORDER — CEFAZOLIN SODIUM-DEXTROSE 2-4 GM/100ML-% IV SOLN
INTRAVENOUS | Status: AC
  Filled 2018-09-10: qty 100

## 2018-09-10 MED ORDER — IBUPROFEN 800 MG PO TABS: 800.0000 mg | ORAL_TABLET | Freq: Three times a day (TID) | ORAL | 0 refills | Status: DC | PRN

## 2018-09-10 MED ORDER — GLYCOPYRROLATE 0.2 MG/ML IJ SOLN
INTRAMUSCULAR | Status: DC | PRN
  Administered 2018-09-10: 0.2 mg via INTRAVENOUS

## 2018-09-10 SURGICAL SUPPLY — 50 items
ADH SKN CLS APL DERMABOND .7 (GAUZE/BANDAGES/DRESSINGS) ×1
APPLIER CLIP 9.375 MED OPEN (MISCELLANEOUS)
APR CLP MED 9.3 20 MLT OPN (MISCELLANEOUS)
BINDER BREAST XLRG (GAUZE/BANDAGES/DRESSINGS) ×2 IMPLANT
BLADE SURG 15 STRL LF DISP TIS (BLADE) ×1 IMPLANT
BLADE SURG 15 STRL SS (BLADE) ×3
CANISTER SUC SOCK COL 7IN (MISCELLANEOUS) IMPLANT
CANISTER SUCT 1200ML W/VALVE (MISCELLANEOUS) IMPLANT
CHLORAPREP W/TINT 26ML (MISCELLANEOUS) ×3 IMPLANT
CLIP APPLIE 9.375 MED OPEN (MISCELLANEOUS) IMPLANT
COVER BACK TABLE 60X90IN (DRAPES) ×3 IMPLANT
COVER MAYO STAND STRL (DRAPES) ×3 IMPLANT
COVER PROBE W GEL 5X96 (DRAPES) ×3 IMPLANT
DECANTER SPIKE VIAL GLASS SM (MISCELLANEOUS) IMPLANT
DERMABOND ADVANCED (GAUZE/BANDAGES/DRESSINGS) ×2
DERMABOND ADVANCED .7 DNX12 (GAUZE/BANDAGES/DRESSINGS) ×1 IMPLANT
DEVICE DUBIN W/COMP PLATE 8390 (MISCELLANEOUS) ×3 IMPLANT
DRAPE LAPAROTOMY 100X72 PEDS (DRAPES) ×3 IMPLANT
DRAPE UTILITY XL STRL (DRAPES) ×3 IMPLANT
ELECT COATED BLADE 2.86 ST (ELECTRODE) ×3 IMPLANT
ELECT REM PT RETURN 9FT ADLT (ELECTROSURGICAL) ×3
ELECTRODE REM PT RTRN 9FT ADLT (ELECTROSURGICAL) ×1 IMPLANT
GLOVE BIO SURGEON STRL SZ 6.5 (GLOVE) ×1 IMPLANT
GLOVE BIO SURGEONS STRL SZ 6.5 (GLOVE) ×1
GLOVE BIOGEL PI IND STRL 7.0 (GLOVE) IMPLANT
GLOVE BIOGEL PI IND STRL 8 (GLOVE) ×1 IMPLANT
GLOVE BIOGEL PI INDICATOR 7.0 (GLOVE) ×2
GLOVE BIOGEL PI INDICATOR 8 (GLOVE) ×2
GLOVE ECLIPSE 8.0 STRL XLNG CF (GLOVE) ×3 IMPLANT
GOWN STRL REUS W/ TWL LRG LVL3 (GOWN DISPOSABLE) ×2 IMPLANT
GOWN STRL REUS W/TWL LRG LVL3 (GOWN DISPOSABLE) ×6
HEMOSTAT ARISTA ABSORB 3G PWDR (MISCELLANEOUS) IMPLANT
HEMOSTAT SNOW SURGICEL 2X4 (HEMOSTASIS) IMPLANT
KIT MARKER MARGIN INK (KITS) ×3 IMPLANT
NDL HYPO 25X1 1.5 SAFETY (NEEDLE) ×1 IMPLANT
NEEDLE HYPO 25X1 1.5 SAFETY (NEEDLE) ×3 IMPLANT
NS IRRIG 1000ML POUR BTL (IV SOLUTION) ×3 IMPLANT
PACK BASIN DAY SURGERY FS (CUSTOM PROCEDURE TRAY) ×3 IMPLANT
PENCIL BUTTON HOLSTER BLD 10FT (ELECTRODE) ×3 IMPLANT
SLEEVE SCD COMPRESS KNEE MED (MISCELLANEOUS) ×3 IMPLANT
SPONGE LAP 4X18 RFD (DISPOSABLE) ×3 IMPLANT
SUT MNCRL AB 4-0 PS2 18 (SUTURE) ×3 IMPLANT
SUT SILK 2 0 SH (SUTURE) IMPLANT
SUT VICRYL 3-0 CR8 SH (SUTURE) ×3 IMPLANT
SYR CONTROL 10ML LL (SYRINGE) ×3 IMPLANT
TOWEL GREEN STERILE FF (TOWEL DISPOSABLE) ×3 IMPLANT
TOWEL OR NON WOVEN STRL DISP B (DISPOSABLE) ×1 IMPLANT
TUBE CONNECTING 20'X1/4 (TUBING) ×1
TUBE CONNECTING 20X1/4 (TUBING) ×1 IMPLANT
YANKAUER SUCT BULB TIP NO VENT (SUCTIONS) ×2 IMPLANT

## 2018-09-10 NOTE — Op Note (Signed)
Preoperative diagnosis: Left breast mass (complex sclerosing lesion)  Postoperative diagnosis: Same  Procedure: Left breast seed localized lumpectomy  Surgeon: Thomas Cornett, MD  Anesthesia: LMA with local  EBL: 10 cc  Specimen: Left breast tissue with seed and clip verified by Faxitron  Drains: None  IV fluids: Per anesthesia record  Indications for procedure: The patient is a 67-year-old female who underwent screening mammogram.  A mammographic abnormality was detected in the left breast.  Core biopsy showed a complex sclerosing lesion.  We discussed options of observation versus excision.  We discussed potential upgrade risk to malignancy of being 10% with these lesions and core biopsy.  We discussed observation as well.  She opted for lumpectomy.The procedure has been discussed with the patient. Alternatives to surgery have been discussed with the patient.  Risks of surgery include bleeding,  Infection,  Seroma formation, death,  and the need for further surgery.   The patient understands and wishes to proceed.  Description of procedure: The patient was met in the holding area.  The left breast was marked as the correct side.  Neoprobe was used to verify seed location.  Questions were answered.  She was taken back to the operating room.  She was placed supine upon the operating table.  After induction of LMA anesthesia the left breast was prepped and draped in sterile fashion timeout was done.  She received appropriate preoperative antibiotics.  Neoprobe was used to identify the seed in the left breast upper outer quadrant.  Curvilinear incision was made over the spot.  Dissection was carried down and all tissue around the seed and clip were excised with a grossly negative margin.  Hemostasis was achieved with local anesthetic consisting of 0.25% Sensorcaine with epinephrine was infiltrated in the lumpectomy cavity.  Hemostasis achieved.  Wound closed with 3-0 Vicryl and 4 Monocryl.   Dermabond applied.  Breast binder placed.  All counts correct.  The patient was awoke extubated taken to recovery in satisfactory condition. 

## 2018-09-10 NOTE — Transfer of Care (Signed)
Immediate Anesthesia Transfer of Care Note  Patient: Lisa Oliver  Procedure(s) Performed: BREAST LUMPECTOMY WITH RADIOACTIVE SEED LOCALIZATION (Left Breast)  Patient Location: PACU  Anesthesia Type:General  Level of Consciousness: awake and patient cooperative  Airway & Oxygen Therapy: Patient Spontanous Breathing and Patient connected to face mask oxygen  Post-op Assessment: Report given to RN and Post -op Vital signs reviewed and stable  Post vital signs: Reviewed and stable  Last Vitals:  Vitals Value Taken Time  BP    Temp    Pulse    Resp    SpO2      Last Pain:  Vitals:   09/10/18 0717  TempSrc: Oral  PainSc: 0-No pain         Complications: No apparent anesthesia complications

## 2018-09-10 NOTE — Anesthesia Preprocedure Evaluation (Addendum)
Anesthesia Evaluation  Patient identified by MRN, date of birth, ID band Patient awake    Reviewed: Allergy & Precautions, NPO status , Patient's Chart, lab work & pertinent test results  Airway Mallampati: II  TM Distance: >3 FB Neck ROM: Full    Dental  (+) Teeth Intact, Dental Advisory Given Lower permanent bridge :   Pulmonary neg pulmonary ROS,    Pulmonary exam normal breath sounds clear to auscultation       Cardiovascular Exercise Tolerance: Good negative cardio ROS Normal cardiovascular exam Rhythm:Regular Rate:Normal     Neuro/Psych negative neurological ROS  negative psych ROS   GI/Hepatic negative GI ROS, Neg liver ROS,   Endo/Other  Hypothyroidism Obesity   Renal/GU negative Renal ROS     Musculoskeletal negative musculoskeletal ROS (+)   Abdominal   Peds  Hematology negative hematology ROS (+)   Anesthesia Other Findings Day of surgery medications reviewed with the patient.  Reproductive/Obstetrics                            Anesthesia Physical Anesthesia Plan  ASA: II  Anesthesia Plan: General   Post-op Pain Management:    Induction: Intravenous  PONV Risk Score and Plan: 3 and Midazolam, Dexamethasone and Ondansetron  Airway Management Planned: LMA  Additional Equipment:   Intra-op Plan:   Post-operative Plan: Extubation in OR  Informed Consent: I have reviewed the patients History and Physical, chart, labs and discussed the procedure including the risks, benefits and alternatives for the proposed anesthesia with the patient or authorized representative who has indicated his/her understanding and acceptance.   Dental advisory given  Plan Discussed with: CRNA  Anesthesia Plan Comments:         Anesthesia Quick Evaluation

## 2018-09-10 NOTE — Anesthesia Procedure Notes (Signed)
Procedure Name: LMA Insertion Date/Time: 09/10/2018 8:56 AM Performed by: Marrianne Mood, CRNA Pre-anesthesia Checklist: Patient identified, Emergency Drugs available, Suction available, Patient being monitored and Timeout performed Patient Re-evaluated:Patient Re-evaluated prior to induction Oxygen Delivery Method: Circle system utilized Preoxygenation: Pre-oxygenation with 100% oxygen Induction Type: IV induction Ventilation: Mask ventilation without difficulty LMA: LMA inserted LMA Size: 4.0 Number of attempts: 1 Airway Equipment and Method: Bite block Placement Confirmation: positive ETCO2 Tube secured with: Tape Dental Injury: Teeth and Oropharynx as per pre-operative assessment

## 2018-09-10 NOTE — Discharge Instructions (Signed)
[facility] °Office Phone Number [phone] ° °BREAST BIOPSY/ PARTIAL MASTECTOMY: POST OP INSTRUCTIONS ° °Always review your discharge instruction sheet given to you by the facility where your surgery was performed. ° °IF YOU HAVE DISABILITY OR FAMILY LEAVE FORMS, YOU MUST BRING THEM TO THE OFFICE FOR PROCESSING.  DO NOT GIVE THEM TO YOUR DOCTOR. ° °1. A prescription for pain medication may be given to you upon discharge.  Take your pain medication as prescribed, if needed.  If narcotic pain medicine is not needed, then you may take acetaminophen (Tylenol) or ibuprofen (Advil) as needed. °2. Take your usually prescribed medications unless otherwise directed °3. If you need a refill on your pain medication, please contact your pharmacy.  They will contact our office to request authorization.  Prescriptions will not be filled after 5pm or on week-ends. °4. You should eat very light the first 24 hours after surgery, such as soup, crackers, pudding, etc.  Resume your normal diet the day after surgery. °5. Most patients will experience some swelling and bruising in the breast.  Ice packs and a good support bra will help.  Swelling and bruising can take several days to resolve.  °6. It is common to experience some constipation if taking pain medication after surgery.  Increasing fluid intake and taking a stool softener will usually help or prevent this problem from occurring.  A mild laxative (Milk of Magnesia or Miralax) should be taken according to package directions if there are no bowel movements after 48 hours. °7. Unless discharge instructions indicate otherwise, you may remove your bandages 24-48 hours after surgery, and you may shower at that time.  You may have steri-strips (small skin tapes) in place directly over the incision.  These strips should be left on the skin for 7-10 days.  If your surgeon used skin glue on the incision, you may shower in 24 hours.  The glue will flake off over the  next 2-3 weeks.  Any sutures or staples will be removed at the office during your follow-up visit. °8. ACTIVITIES:  You may resume regular daily activities (gradually increasing) beginning the next day.  Wearing a good support bra or sports bra minimizes pain and swelling.  You may have sexual intercourse when it is comfortable. °a. You may drive when you no longer are taking prescription pain medication, you can comfortably wear a seatbelt, and you can safely maneuver your car and apply brakes. °b. RETURN TO WORK:  ______________________________________________________________________________________ °9. You should see your doctor in the office for a follow-up appointment approximately two weeks after your surgery.  Your doctor’s nurse will typically make your follow-up appointment when she calls you with your pathology report.  Expect your pathology report 2-3 business days after your surgery.  You may call to check if you do not hear from us after three days. °10. OTHER INSTRUCTIONS: _______________________________________________________________________________________________ _____________________________________________________________________________________________________________________________________ °_____________________________________________________________________________________________________________________________________ °_____________________________________________________________________________________________________________________________________ ° °WHEN TO CALL YOUR DOCTOR: °1. Fever over 101.0 °2. Nausea and/or vomiting. °3. Extreme swelling or bruising. °4. Continued bleeding from incision. °5. Increased pain, redness, or drainage from the incision. ° °The clinic staff is available to answer your questions during regular business hours.  Please don’t hesitate to call and ask to speak to one of the nurses for clinical concerns.  If you have a medical emergency, go to the nearest  emergency room or call 911.  A surgeon from [facility] is always on call at the hospital. ° °For further questions, please visit [email or web address]  ° ° ° ° °  Post Anesthesia Home Care Instructions ° °Activity: °Get plenty of rest for the remainder of the day. A responsible individual must stay with you for 24 hours following the procedure.  °For the next 24 hours, DO NOT: °-Drive a car °-Operate machinery °-Drink alcoholic beverages °-Take any medication unless instructed by your physician °-Make any legal decisions or sign important papers. ° °Meals: °Start with liquid foods such as gelatin or soup. Progress to regular foods as tolerated. Avoid greasy, spicy, heavy foods. If nausea and/or vomiting occur, drink only clear liquids until the nausea and/or vomiting subsides. Call your physician if vomiting continues. ° °Special Instructions/Symptoms: °Your throat may feel dry or sore from the anesthesia or the breathing tube placed in your throat during surgery. If this causes discomfort, gargle with warm salt water. The discomfort should disappear within 24 hours. ° °If you had a scopolamine patch placed behind your ear for the management of post- operative nausea and/or vomiting: ° °1. The medication in the patch is effective for 72 hours, after which it should be removed.  Wrap patch in a tissue and discard in the trash. Wash hands thoroughly with soap and water. °2. You may remove the patch earlier than 72 hours if you experience unpleasant side effects which may include dry mouth, dizziness or visual disturbances. °3. Avoid touching the patch. Wash your hands with soap and water after contact with the patch. °  ° °

## 2018-09-10 NOTE — Interval H&P Note (Signed)
History and Physical Interval Note:  09/10/2018 8:25 AM  Lisa Oliver  has presented today for surgery, with the diagnosis of LEFT BREAST MASS  The various methods of treatment have been discussed with the patient and family. After consideration of risks, benefits and other options for treatment, the patient has consented to  Procedure(s): BREAST LUMPECTOMY WITH RADIOACTIVE SEED LOCALIZATION (Left) as a surgical intervention .  The patient's history has been reviewed, patient examined, no change in status, stable for surgery.  I have reviewed the patient's chart and labs.  Questions were answered to the patient's satisfaction.     [place]

## 2018-09-10 NOTE — Anesthesia Postprocedure Evaluation (Signed)
Anesthesia Post Note  Patient: Lisa Oliver  Procedure(s) Performed: BREAST LUMPECTOMY WITH RADIOACTIVE SEED LOCALIZATION (Left Breast)     Patient location during evaluation: PACU Anesthesia Type: General Level of consciousness: awake and alert, oriented and awake Pain management: pain level controlled Vital Signs Assessment: post-procedure vital signs reviewed and stable Respiratory status: spontaneous breathing, nonlabored ventilation and respiratory function stable Cardiovascular status: blood pressure returned to baseline and stable Postop Assessment: no apparent nausea or vomiting Anesthetic complications: no    Last Vitals:  Vitals:   09/10/18 1000 09/10/18 1033  BP: 112/74 136/69  Pulse: 97 88  Resp: (!) 22 16  Temp:  36.4 C  SpO2: 94% 95%    Last Pain:  Vitals:   09/10/18 1033  TempSrc: Oral  PainSc: 0-No pain                 Catalina Gravel

## 2018-09-11 ENCOUNTER — Encounter (HOSPITAL_BASED_OUTPATIENT_CLINIC_OR_DEPARTMENT_OTHER): Payer: Self-pay | Admitting: Surgery

## 2018-09-11 NOTE — Addendum Note (Signed)
Addendum  created 09/11/18 1157 by Klever Twyford, Ernesta Amble, CRNA   Charge Capture section accepted

## 2019-09-24 NOTE — Progress Notes (Signed)
PCP - Dr Antony Contras  Cardiologist -   Chest x-ray -  EKG -  Stress Test -  ECHO -  Cardiac Cath -   Sleep Study -  CPAP -   Fasting Blood Sugar -  Checks Blood Sugar _____ times a day  Blood Thinner Instructions: Aspirin Instructions: Last Dose:  Anesthesia review:   Patient denies shortness of breath, fever, cough and chest pain at PAT appointment   Patient verbalized understanding of instructions that were given to them at the PAT appointment. Patient was also instructed that they will need to review over the PAT instructions again at home before surgery.

## 2019-09-24 NOTE — Patient Instructions (Addendum)
DUE TO COVID-19 ONLY ONE VISITOR IS ALLOWED TO COME WITH YOU AND STAY IN THE WAITING ROOM ONLY DURING PRE OP AND PROCEDURE DAY OF SURGERY. THE 1 VISITOR MAY VISIT WITH YOU AFTER SURGERY IN YOUR PRIVATE ROOM DURING VISITING HOURS ONLY!  YOU NEED TO HAVE A COVID 19 TEST ON 10-01-19  @ 9:55 AM, THIS TEST MUST BE DONE BEFORE SURGERY, COME  [address], [place] , [address].  ([place]) ONCE YOUR COVID TEST IS COMPLETED, PLEASE BEGIN THE QUARANTINE INSTRUCTIONS AS OUTLINED IN YOUR HANDOUT.                SHIRONDA CAPONIGRO  09/24/2019   Your procedure is scheduled on: 10-05-19   Report to [facility] Main  Entrance    Report to Admitting at  6:10 AM     Call this number if you have problems the morning of surgery [phone]    Remember: NO SOLID FOOD AFTER MIDNIGHT THE NIGHT PRIOR TO SURGERY. NOTHING BY MOUTH EXCEPT CLEAR LIQUIDS UNTIL 5:40 AM . PLEASE FINISH ENSURE DRINK PER SURGEON ORDER  WHICH NEEDS TO BE COMPLETED AT 5:40 AM.   CLEAR LIQUID DIET   Foods Allowed                                                                     Foods Excluded  Coffee and tea, regular and decaf                             liquids that you cannot  Plain Jell-O any favor except red or purple                                           see through such as: Fruit ices (not with fruit pulp)                                     milk, soups, orange juice  Iced Popsicles                                    All solid food Carbonated beverages, regular and diet                                    Cranberry, grape and apple juices Sports drinks like Gatorade Lightly seasoned clear broth or consume(fat free) Sugar, honey syrup    _____________________________________________________________________      Take these medicines the morning of surgery with A SIP OF WATER: Synthroid  BRUSH YOUR TEETH MORNING OF SURGERY AND RINSE YOUR MOUTH OUT, NO CHEWING GUM CANDY OR MINTS.                                  You may not have any metal on your  body including hair pins and              piercings     Do not wear jewelry, make-up, lotions, powders or perfumes, deodorant              Do not wear nail polish on your fingernails.  Do not shave  48 hours prior to surgery.                Do not bring valuables to the hospital. [place].  Contacts, dentures or bridgework may not be worn into surgery.  You may bring a small overnight bag     Special Instructions: N/A              Please read over the following fact sheets you were given: _____________________________________________________________________             [facility] - Preparing for Surgery Before surgery, you can play an important role.  Because skin is not sterile, your skin needs to be as free of germs as possible.  You can reduce the number of germs on your skin by washing with CHG (chlorahexidine gluconate) soap before surgery.  CHG is an antiseptic cleaner which kills germs and bonds with the skin to continue killing germs even after washing. Please DO NOT use if you have an allergy to CHG or antibacterial soaps.  If your skin becomes reddened/irritated stop using the CHG and inform your nurse when you arrive at Short Stay. Do not shave (including legs and underarms) for at least 48 hours prior to the first CHG shower.  You may shave your face/neck. Please follow these instructions carefully:  1.  Shower with CHG Soap the night before surgery and the  morning of Surgery.  2.  If you choose to wash your hair, wash your hair first as usual with your  normal  shampoo.  3.  After you shampoo, rinse your hair and body thoroughly to remove the  shampoo.                           4.  Use CHG as you would any other liquid soap.  You can apply chg directly  to the skin and wash                       Gently with a scrungie or clean washcloth.  5.  Apply the CHG Soap  to your body ONLY FROM THE NECK DOWN.   Do not use on face/ open                           Wound or open sores. Avoid contact with eyes, ears mouth and genitals (private parts).                       Wash face,  Genitals (private parts) with your normal soap.             6.  Wash thoroughly, paying special attention to the area where your surgery  will be performed.  7.  Thoroughly rinse your body with warm water from the neck down.  8.  DO NOT shower/wash with your normal soap after using and rinsing off  the CHG Soap.  9.  Pat yourself dry with a clean towel.            10.  Wear clean pajamas.            11.  Place clean sheets on your bed the night of your first shower and do not  sleep with pets. Day of Surgery : Do not apply any lotions/deodorants the morning of surgery.  Please wear clean clothes to the hospital/surgery center.  FAILURE TO FOLLOW THESE INSTRUCTIONS MAY RESULT IN THE CANCELLATION OF YOUR SURGERY PATIENT SIGNATURE_________________________________  NURSE SIGNATURE__________________________________  ________________________________________________________________________   Adam Phenix  An incentive spirometer is a tool that can help keep your lungs clear and active. This tool measures how well you are filling your lungs with each breath. Taking long deep breaths may help reverse or decrease the chance of developing breathing (pulmonary) problems (especially infection) following:  A long period of time when you are unable to move or be active. BEFORE THE PROCEDURE   If the spirometer includes an indicator to show your best effort, your nurse or respiratory therapist will set it to a desired goal.  If possible, sit up straight or lean slightly forward. Try not to slouch.  Hold the incentive spirometer in an upright position. INSTRUCTIONS FOR USE  1. Sit on the edge of your bed if possible, or sit up as far as you can in bed or on a  chair. 2. Hold the incentive spirometer in an upright position. 3. Breathe out normally. 4. Place the mouthpiece in your mouth and seal your lips tightly around it. 5. Breathe in slowly and as deeply as possible, raising the piston or the ball toward the top of the column. 6. Hold your breath for 3-5 seconds or for as long as possible. Allow the piston or ball to fall to the bottom of the column. 7. Remove the mouthpiece from your mouth and breathe out normally. 8. Rest for a few seconds and repeat Steps 1 through 7 at least 10 times every 1-2 hours when you are awake. Take your time and take a few normal breaths between deep breaths. 9. The spirometer may include an indicator to show your best effort. Use the indicator as a goal to work toward during each repetition. 10. After each set of 10 deep breaths, practice coughing to be sure your lungs are clear. If you have an incision (the cut made at the time of surgery), support your incision when coughing by placing a pillow or rolled up towels firmly against it. Once you are able to get out of bed, walk around indoors and cough well. You may stop using the incentive spirometer when instructed by your caregiver.  RISKS AND COMPLICATIONS  Take your time so you do not get dizzy or light-headed.  If you are in pain, you may need to take or ask for pain medication before doing incentive spirometry. It is harder to take a deep breath if you are having pain. AFTER USE  Rest and breathe slowly and easily.  It can be helpful to keep track of a log of your progress. Your caregiver can provide you with a simple table to help with this. If you are using the spirometer at home, follow these instructions: [place] IF:   You are having difficultly using the spirometer.  You have trouble using the spirometer as often as instructed.  Your pain medication is not giving enough relief while using the spirometer.  You develop  fever of 100.5 F  (38.1 C) or higher. SEEK IMMEDIATE MEDICAL CARE IF:   You cough up bloody sputum that had not been present before.  You develop fever of 102 F (38.9 C) or greater.  You develop worsening pain at or near the incision site. MAKE SURE YOU:   Understand these instructions.  Will watch your condition.  Will get help right away if you are not doing well or get worse. Document Released: 04/14/2007 Document Revised: 02/24/2012 Document Reviewed: 06/15/2007 ExitCare Patient Information 2014 Exit[employer], [place].   ________________________________________________________________________  WHAT IS A BLOOD TRANSFUSION? Blood Transfusion Information  A transfusion is the replacement of blood or some of its parts. Blood is made up of multiple cells which provide different functions.  Red blood cells carry oxygen and are used for blood loss replacement.  White blood cells fight against infection.  Platelets control bleeding.  Plasma helps clot blood.  Other blood products are available for specialized needs, such as hemophilia or other clotting disorders. BEFORE THE TRANSFUSION  Who gives blood for transfusions?   Healthy volunteers who are fully evaluated to make sure their blood is safe. This is blood bank blood. Transfusion therapy is the safest it has ever been in the practice of medicine. Before blood is taken from a donor, a complete history is taken to make sure that person has no history of diseases nor engages in risky social behavior (examples are intravenous drug use or sexual activity with multiple partners). The donor's travel history is screened to minimize risk of transmitting infections, such as malaria. The donated blood is tested for signs of infectious diseases, such as HIV and hepatitis. The blood is then tested to be sure it is compatible with you in order to minimize the chance of a transfusion reaction. If you or a relative donates blood, this is often done in anticipation  of surgery and is not appropriate for emergency situations. It takes many days to process the donated blood. RISKS AND COMPLICATIONS Although transfusion therapy is very safe and saves many lives, the main dangers of transfusion include:   Getting an infectious disease.  Developing a transfusion reaction. This is an allergic reaction to something in the blood you were given. Every precaution is taken to prevent this. The decision to have a blood transfusion has been considered carefully by your caregiver before blood is given. Blood is not given unless the benefits outweigh the risks. AFTER THE TRANSFUSION  Right after receiving a blood transfusion, you will usually feel much better and more energetic. This is especially true if your red blood cells have gotten low (anemic). The transfusion raises the level of the red blood cells which carry oxygen, and this usually causes an energy increase.  The nurse administering the transfusion will monitor you carefully for complications. HOME CARE INSTRUCTIONS  No special instructions are needed after a transfusion. You may find your energy is better. Speak with your caregiver about any limitations on activity for underlying diseases you may have. SEEK MEDICAL CARE IF:   Your condition is not improving after your transfusion.  You develop redness or irritation at the intravenous (IV) site. SEEK IMMEDIATE MEDICAL CARE IF:  Any of the following symptoms occur over the next 12 hours:  Shaking chills.  You have a temperature by mouth above 102 F (38.9 C), not controlled by medicine.  Chest, back, or muscle pain.  People around you feel you are not acting correctly or are confused.  Shortness of breath  or difficulty breathing.  Dizziness and fainting.  You get a rash or develop hives.  You have a decrease in urine output.  Your urine turns a dark color or changes to pink, red, or brown. Any of the following symptoms occur over the next 10  days:  You have a temperature by mouth above 102 F (38.9 C), not controlled by medicine.  Shortness of breath.  Weakness after normal activity.  The white part of the eye turns yellow (jaundice).  You have a decrease in the amount of urine or are urinating less often.  Your urine turns a dark color or changes to pink, red, or brown. Document Released: 11/29/2000 Document Revised: 02/24/2012 Document Reviewed: 07/18/2008 [facility] Patient Information 2014 [place], [place].  _______________________________________________________________________

## 2019-09-27 ENCOUNTER — Encounter (HOSPITAL_COMMUNITY): Payer: Self-pay

## 2019-09-27 ENCOUNTER — Encounter (HOSPITAL_COMMUNITY)
Admission: RE | Admit: 2019-09-27 | Discharge: 2019-09-27 | Disposition: A | Discharge status: Home / Self Care | Payer: Medicare Other | Source: Ambulatory Visit | Attending: Orthopedic Surgery | Admitting: Orthopedic Surgery

## 2019-09-27 ENCOUNTER — Other Ambulatory Visit: Payer: Self-pay

## 2019-09-27 DIAGNOSIS — Z01812 Encounter for preprocedural laboratory examination: Secondary | ICD-10-CM | POA: Insufficient documentation

## 2019-09-27 DIAGNOSIS — M25551 Pain in right hip: Secondary | ICD-10-CM | POA: Diagnosis not present

## 2019-09-27 DIAGNOSIS — M1611 Unilateral primary osteoarthritis, right hip: Secondary | ICD-10-CM | POA: Insufficient documentation

## 2019-09-27 LAB — CBC
HCT: 42.8 % (ref 36.0–46.0)
Hemoglobin: 13.7 g/dL (ref 12.0–15.0)
MCH: 28.8 pg (ref 26.0–34.0)
MCHC: 32 g/dL (ref 30.0–36.0)
MCV: 90.1 fL (ref 80.0–100.0)
Platelets: 328 10*3/uL (ref 150–400)
RBC: 4.75 MIL/uL (ref 3.87–5.11)
RDW: 14.1 % (ref 11.5–15.5)
WBC: 6.6 10*3/uL (ref 4.0–10.5)
nRBC: 0 % (ref 0.0–0.2)

## 2019-09-27 LAB — SURGICAL PCR SCREEN
MRSA, PCR: NEGATIVE
Staphylococcus aureus: NEGATIVE

## 2019-09-27 LAB — ABO/RH: ABO/RH(D): A POS

## 2019-09-29 ENCOUNTER — Other Ambulatory Visit (HOSPITAL_COMMUNITY): Payer: Medicare Other

## 2019-09-29 NOTE — H&P (Signed)
TOTAL HIP ADMISSION H&P  Patient is admitted for right total hip arthroplasty, anterior approach.  Subjective:  Chief Complaint:   Right hip primary OA / pain  HPI: Lisa Oliver, 68 y.o. female, has a history of pain and functional disability in the right hip(s) due to arthritis and patient has failed non-surgical conservative treatments for greater than 12 weeks to include NSAID's and/or analgesics and activity modification.  Onset of symptoms was gradual starting 1+ years ago with gradually worsening course since that time.The patient noted no past surgery on the right hip(s).  Patient currently rates pain in the right hip at 10 out of 10 with activity. Patient has night pain, worsening of pain with activity and weight bearing, trendelenberg gait, pain that interfers with activities of daily living and pain with passive range of motion. Patient has evidence of periarticular osteophytes and joint space narrowing by imaging studies. This condition presents safety issues increasing the risk of falls.  There is no current active infection.  Risks, benefits and expectations were discussed with the patient.  Risks including but not limited to the risk of anesthesia, blood clots, nerve damage, blood vessel damage, failure of the prosthesis, infection and up to and including death.  Patient understand the risks, benefits and expectations and wishes to proceed with surgery.   PCP: Antony Contras, MD  D/C Plans:       Home   Post-op Meds:       No Rx given  Tranexamic Acid:      To be given - IV   Decadron:      Is to be given  FYI:      ASA  [place]co  DME:   Pt equipment Rx sent  PT:   HEP  Pharmacy: [facility] [employer]     Past Medical History:  Diagnosis Date  . Medical history non-contributory     Past Surgical History:  Procedure Laterality Date  . BREAST LUMPECTOMY WITH RADIOACTIVE SEED LOCALIZATION Left 09/10/2018   Procedure: BREAST LUMPECTOMY WITH RADIOACTIVE SEED LOCALIZATION;   Surgeon: Erroll Luna, MD;  Location: [facility];  Service: General;  Laterality: Left;  . BREAST SURGERY    . DIAGNOSTIC LAPAROSCOPY     cyst on ovary  . DILATION AND CURETTAGE OF UTERUS      No current facility-administered medications for this encounter.    Current Outpatient Medications  Medication Sig Dispense Refill Last Dose  . b complex vitamins capsule Take 1 capsule by mouth daily as needed (IMMUNE SUPPORT/ENERGY).     . Cholecalciferol (VITAMIN D-3) 125 MCG (5000 UT) TABS Take 5,000 Units by mouth daily with breakfast.     . Digestive Enzymes (DIGESTIVE ENZYME PO) Take 1 capsule by mouth daily with breakfast. Enzyme Forte     . ibuprofen (ADVIL) 200 MG tablet Take 400 mg by mouth 2 (two) times daily as needed (PAIN.).     . Iodine, Kelp, (KELP PO) Take 225 mcg by mouth daily.     . metroNIDAZOLE (METROGEL) 1 % gel Apply 1 application topically daily. rosacea     . MULTIPLE MINERALS PO Take 1 tablet by mouth daily.      . Multiple Vitamin (MULTIVITAMIN WITH MINERALS) TABS tablet Take 1 tablet by mouth daily with breakfast.     . NATURAL BALANCE TEARS 0.1-0.3 % SOLN Place 1 drop into both eyes at bedtime as needed (DRY/IRRITATED EYES.).     Marland Kitchen Omega-3 Fatty Acids (OMEGA-3 FISH OIL PO)  Take 2 capsules by mouth daily with breakfast.     . SYNTHROID 112 MCG tablet Take 112 mcg by mouth every evening. Brand Name ONLY     . VITAMIN A PO Take 1 capsule by mouth daily as needed (IMMUNE SUPPORT). water soluble     . vitamin C (ASCORBIC ACID) 500 MG tablet Take 500 mg by mouth daily as needed (IMMUNE SUPPORT).     . vitamin E 400 UNIT capsule Take 400 Units by mouth 4 (four) times a week.      No Known Allergies   Social History   Tobacco Use  . Smoking status: Never Smoker  . Smokeless tobacco: Never Used  Substance Use Topics  . Alcohol use: Yes    Alcohol/week: 1.0 standard drinks    Types: 1 Shots of liquor per week    Comment: weekly    Family History   Problem Relation Age of Onset  . Breast cancer Neg Hx      Review of Systems  Constitutional: Negative.   HENT: Negative.   Eyes: Negative.   Respiratory: Negative.   Cardiovascular: Negative.   Gastrointestinal: Negative.   Genitourinary: Positive for frequency.  Musculoskeletal: Positive for joint pain.  Skin: Negative.   Neurological: Negative.   Endo/Heme/Allergies: Negative.   Psychiatric/Behavioral: Negative.     Objective:  Physical Exam  Constitutional: She is oriented to person, place, and time. She appears well-developed.  HENT:  Head: Normocephalic.  Eyes: Pupils are equal, round, and reactive to light.  Neck: Neck supple. No JVD present. No tracheal deviation present. No thyromegaly present.  Cardiovascular: Normal rate, regular rhythm and intact distal pulses.  Respiratory: Effort normal and breath sounds normal. No respiratory distress. She has no wheezes.  GI: Soft. There is no abdominal tenderness. There is no guarding.  Musculoskeletal:     Right hip: She exhibits decreased range of motion, decreased strength, tenderness and bony tenderness. She exhibits no swelling, no deformity and no laceration.  Lymphadenopathy:    She has no cervical adenopathy.  Neurological: She is alert and oriented to person, place, and time. A sensory deficit (slight numbness in LEs) is present.  Skin: Skin is warm and dry.  Psychiatric: She has a normal mood and affect.      Labs:   Estimated body mass index is 29.69 kg/m as calculated from the following:   Height as of 09/27/19: 5\' 9"  (1.753 m).   Weight as of 09/27/19: 91.2 kg.   Imaging Review Plain radiographs demonstrate severe degenerative joint disease of the right hip(s). The bone quality appears to be good for age and reported activity level.      Assessment/Plan:  End stage arthritis, right hip(s)  The patient history, physical examination, clinical judgement of the provider and imaging studies are  consistent with end stage degenerative joint disease of the right hip(s) and total hip arthroplasty is deemed medically necessary. The treatment options including medical management, injection therapy, arthroscopy and arthroplasty were discussed at length. The risks and benefits of total hip arthroplasty were presented and reviewed. The risks due to aseptic loosening, infection, stiffness, dislocation/subluxation,  thromboembolic complications and other imponderables were discussed.  The patient acknowledged the explanation, agreed to proceed with the plan and consent was signed. Patient is being admitted for inpatient treatment for surgery, pain control, PT, OT, prophylactic antibiotics, VTE prophylaxis, progressive ambulation and ADL's and discharge planning.The patient is planning to be discharged home.     [place]  PA-C  09/29/2019, 1:29 PM

## 2019-10-01 ENCOUNTER — Other Ambulatory Visit (HOSPITAL_COMMUNITY)
Admission: RE | Admit: 2019-10-01 | Discharge: 2019-10-01 | Disposition: A | Discharge status: Home / Self Care | Payer: Medicare Other | Source: Ambulatory Visit | Attending: Orthopedic Surgery | Admitting: Orthopedic Surgery

## 2019-10-01 DIAGNOSIS — Z20828 Contact with and (suspected) exposure to other viral communicable diseases: Secondary | ICD-10-CM | POA: Diagnosis not present

## 2019-10-01 DIAGNOSIS — Z01812 Encounter for preprocedural laboratory examination: Secondary | ICD-10-CM | POA: Insufficient documentation

## 2019-10-02 LAB — NOVEL CORONAVIRUS, NAA (HOSP ORDER, SEND-OUT TO REF LAB; TAT 18-24 HRS): SARS-CoV-2, NAA: NOT DETECTED

## 2019-10-04 ENCOUNTER — Encounter (HOSPITAL_COMMUNITY): Payer: Self-pay | Admitting: Certified Registered"

## 2019-10-05 ENCOUNTER — Inpatient Hospital Stay (HOSPITAL_COMMUNITY): Payer: Medicare Other | Admitting: Certified Registered"

## 2019-10-05 ENCOUNTER — Inpatient Hospital Stay (HOSPITAL_COMMUNITY): Payer: Medicare Other

## 2019-10-05 ENCOUNTER — Inpatient Hospital Stay (HOSPITAL_COMMUNITY)
Admission: RE | Admit: 2019-10-05 | Discharge: 2019-10-06 | DRG: 470 | Disposition: A | Discharge status: Home / Self Care | Payer: Medicare Other | Source: Other Acute Inpatient Hospital | Attending: Orthopedic Surgery | Admitting: Orthopedic Surgery

## 2019-10-05 ENCOUNTER — Other Ambulatory Visit: Payer: Self-pay

## 2019-10-05 ENCOUNTER — Encounter (HOSPITAL_COMMUNITY)
Admission: RE | Disposition: A | Discharge status: Home / Self Care | Payer: Self-pay | Source: Other Acute Inpatient Hospital | Attending: Orthopedic Surgery

## 2019-10-05 ENCOUNTER — Inpatient Hospital Stay (HOSPITAL_COMMUNITY): Payer: Medicare Other | Admitting: Physician Assistant

## 2019-10-05 ENCOUNTER — Encounter (HOSPITAL_COMMUNITY): Payer: Self-pay | Admitting: Emergency Medicine

## 2019-10-05 DIAGNOSIS — Z6829 Body mass index (BMI) 29.0-29.9, adult: Secondary | ICD-10-CM | POA: Diagnosis not present

## 2019-10-05 DIAGNOSIS — E663 Overweight: Secondary | ICD-10-CM | POA: Diagnosis present

## 2019-10-05 DIAGNOSIS — M1611 Unilateral primary osteoarthritis, right hip: Principal | ICD-10-CM | POA: Diagnosis present

## 2019-10-05 DIAGNOSIS — Z96649 Presence of unspecified artificial hip joint: Secondary | ICD-10-CM

## 2019-10-05 DIAGNOSIS — Z79890 Hormone replacement therapy (postmenopausal): Secondary | ICD-10-CM

## 2019-10-05 DIAGNOSIS — Z888 Allergy status to other drugs, medicaments and biological substances status: Secondary | ICD-10-CM

## 2019-10-05 DIAGNOSIS — Z419 Encounter for procedure for purposes other than remedying health state, unspecified: Secondary | ICD-10-CM

## 2019-10-05 DIAGNOSIS — E039 Hypothyroidism, unspecified: Secondary | ICD-10-CM | POA: Diagnosis present

## 2019-10-05 DIAGNOSIS — Z96641 Presence of right artificial hip joint: Secondary | ICD-10-CM

## 2019-10-05 HISTORY — PX: TOTAL HIP ARTHROPLASTY: SHX124

## 2019-10-05 LAB — TYPE AND SCREEN
ABO/RH(D): A POS
Antibody Screen: NEGATIVE

## 2019-10-05 SURGERY — ARTHROPLASTY, HIP, TOTAL, ANTERIOR APPROACH
Anesthesia: Spinal | Site: Hip | Laterality: Right

## 2019-10-05 MED ORDER — SODIUM CHLORIDE 0.9 % IV BOLUS
500.0000 mL | Freq: Once | INTRAVENOUS | Status: AC
  Administered 2019-10-05: 500 mL via INTRAVENOUS

## 2019-10-05 MED ORDER — ASPIRIN 81 MG PO CHEW
81.0000 mg | CHEWABLE_TABLET | Freq: Two times a day (BID) | ORAL | Status: DC
  Administered 2019-10-05 – 2019-10-06 (×2): 81 mg via ORAL
  Filled 2019-10-05 (×2): qty 1

## 2019-10-05 MED ORDER — ONDANSETRON HCL 4 MG/2ML IJ SOLN: 4.0000 mg | Freq: Once | INTRAMUSCULAR | Status: DC | PRN

## 2019-10-05 MED ORDER — PROPOFOL 10 MG/ML IV BOLUS
INTRAVENOUS | Status: AC
  Filled 2019-10-05: qty 20

## 2019-10-05 MED ORDER — OXYCODONE HCL 5 MG/5ML PO SOLN: 5.0000 mg | Freq: Once | ORAL | Status: DC | PRN

## 2019-10-05 MED ORDER — POLYETHYLENE GLYCOL 3350 17 G PO PACK
17.0000 g | PACK | Freq: Two times a day (BID) | ORAL | Status: DC
  Administered 2019-10-05 – 2019-10-06 (×2): 17 g via ORAL
  Filled 2019-10-05 (×2): qty 1

## 2019-10-05 MED ORDER — METOCLOPRAMIDE HCL 5 MG PO TABS: 5.0000 mg | ORAL_TABLET | Freq: Three times a day (TID) | ORAL | Status: DC | PRN

## 2019-10-05 MED ORDER — SODIUM CHLORIDE 0.9 % IV SOLN
INTRAVENOUS | Status: DC | PRN
  Administered 2019-10-05: 25 ug/min via INTRAVENOUS

## 2019-10-05 MED ORDER — DEXAMETHASONE SODIUM PHOSPHATE 10 MG/ML IJ SOLN
10.0000 mg | Freq: Once | INTRAMUSCULAR | Status: AC
  Administered 2019-10-06: 10 mg via INTRAVENOUS
  Filled 2019-10-05: qty 1

## 2019-10-05 MED ORDER — MENTHOL 3 MG MT LOZG: 1.0000 | LOZENGE | OROMUCOSAL | Status: DC | PRN

## 2019-10-05 MED ORDER — LEVOTHYROXINE SODIUM 112 MCG PO TABS
112.0000 ug | ORAL_TABLET | Freq: Every evening | ORAL | Status: DC
  Administered 2019-10-05: 112 ug via ORAL
  Filled 2019-10-05 (×2): qty 1

## 2019-10-05 MED ORDER — SODIUM CHLORIDE 0.9 % IV SOLN
INTRAVENOUS | Status: DC
  Administered 2019-10-05 – 2019-10-06 (×2): via INTRAVENOUS

## 2019-10-05 MED ORDER — MIDAZOLAM HCL 2 MG/2ML IJ SOLN
INTRAMUSCULAR | Status: AC
  Filled 2019-10-05: qty 2

## 2019-10-05 MED ORDER — FENTANYL CITRATE (PF) 250 MCG/5ML IJ SOLN
INTRAMUSCULAR | Status: AC
  Filled 2019-10-05: qty 5

## 2019-10-05 MED ORDER — PHENOL 1.4 % MT LIQD: 1.0000 | OROMUCOSAL | Status: DC | PRN

## 2019-10-05 MED ORDER — ONDANSETRON HCL 4 MG/2ML IJ SOLN
4.0000 mg | Freq: Four times a day (QID) | INTRAMUSCULAR | Status: DC | PRN
  Administered 2019-10-05 – 2019-10-06 (×3): 4 mg via INTRAVENOUS
  Filled 2019-10-05 (×3): qty 2

## 2019-10-05 MED ORDER — METHOCARBAMOL 500 MG IVPB - SIMPLE MED
500.0000 mg | Freq: Four times a day (QID) | INTRAVENOUS | Status: DC | PRN
  Administered 2019-10-05 (×2): 500 mg via INTRAVENOUS
  Filled 2019-10-05: qty 500
  Filled 2019-10-05: qty 50
  Filled 2019-10-05: qty 500
  Filled 2019-10-05: qty 50

## 2019-10-05 MED ORDER — TRANEXAMIC ACID-NACL 1000-0.7 MG/100ML-% IV SOLN
1000.0000 mg | Freq: Once | INTRAVENOUS | Status: AC
  Administered 2019-10-05: 1000 mg via INTRAVENOUS
  Filled 2019-10-05: qty 100

## 2019-10-05 MED ORDER — MAGNESIUM CITRATE PO SOLN: 1.0000 | Freq: Once | ORAL | Status: DC | PRN

## 2019-10-05 MED ORDER — FENTANYL CITRATE (PF) 100 MCG/2ML IJ SOLN: 25.0000 ug | INTRAMUSCULAR | Status: DC | PRN

## 2019-10-05 MED ORDER — PROPOFOL 10 MG/ML IV BOLUS
INTRAVENOUS | Status: AC
  Filled 2019-10-05: qty 40

## 2019-10-05 MED ORDER — DIPHENHYDRAMINE HCL 12.5 MG/5ML PO ELIX: 12.5000 mg | ORAL_SOLUTION | ORAL | Status: DC | PRN

## 2019-10-05 MED ORDER — ONDANSETRON HCL 4 MG PO TABS: 4.0000 mg | ORAL_TABLET | Freq: Four times a day (QID) | ORAL | Status: DC | PRN

## 2019-10-05 MED ORDER — METOCLOPRAMIDE HCL 5 MG/ML IJ SOLN
5.0000 mg | Freq: Three times a day (TID) | INTRAMUSCULAR | Status: DC | PRN
  Administered 2019-10-05 (×2): 10 mg via INTRAVENOUS
  Filled 2019-10-05 (×2): qty 2

## 2019-10-05 MED ORDER — CEFAZOLIN SODIUM-DEXTROSE 2-4 GM/100ML-% IV SOLN
2.0000 g | Freq: Four times a day (QID) | INTRAVENOUS | Status: AC
  Administered 2019-10-05 (×2): 2 g via INTRAVENOUS
  Filled 2019-10-05 (×2): qty 100

## 2019-10-05 MED ORDER — LACTATED RINGERS IV SOLN
INTRAVENOUS | Status: DC
  Administered 2019-10-05 (×3): via INTRAVENOUS

## 2019-10-05 MED ORDER — CEFAZOLIN SODIUM-DEXTROSE 2-4 GM/100ML-% IV SOLN
2.0000 g | INTRAVENOUS | Status: AC
  Administered 2019-10-05: 2 g via INTRAVENOUS
  Filled 2019-10-05: qty 100

## 2019-10-05 MED ORDER — DOCUSATE SODIUM 100 MG PO CAPS
100.0000 mg | ORAL_CAPSULE | Freq: Two times a day (BID) | ORAL | Status: DC
  Administered 2019-10-05 – 2019-10-06 (×2): 100 mg via ORAL
  Filled 2019-10-05 (×2): qty 1

## 2019-10-05 MED ORDER — DEXAMETHASONE SODIUM PHOSPHATE 10 MG/ML IJ SOLN
10.0000 mg | Freq: Once | INTRAMUSCULAR | Status: AC
  Administered 2019-10-05: 10 mg via INTRAVENOUS

## 2019-10-05 MED ORDER — ALUM & MAG HYDROXIDE-SIMETH 200-200-20 MG/5ML PO SUSP: 15.0000 mL | ORAL | Status: DC | PRN

## 2019-10-05 MED ORDER — BISACODYL 10 MG RE SUPP: 10.0000 mg | Freq: Every day | RECTAL | Status: DC | PRN

## 2019-10-05 MED ORDER — PROPOFOL 500 MG/50ML IV EMUL
INTRAVENOUS | Status: DC | PRN
  Administered 2019-10-05: 25 ug/kg/min via INTRAVENOUS

## 2019-10-05 MED ORDER — FERROUS SULFATE 325 (65 FE) MG PO TABS
325.0000 mg | ORAL_TABLET | Freq: Three times a day (TID) | ORAL | Status: DC
  Administered 2019-10-05 – 2019-10-06 (×2): 325 mg via ORAL
  Filled 2019-10-05 (×2): qty 1

## 2019-10-05 MED ORDER — HYDROMORPHONE HCL 1 MG/ML IJ SOLN
0.5000 mg | INTRAMUSCULAR | Status: DC | PRN
  Administered 2019-10-06: 0.5 mg via INTRAVENOUS
  Filled 2019-10-05: qty 1

## 2019-10-05 MED ORDER — TRANEXAMIC ACID-NACL 1000-0.7 MG/100ML-% IV SOLN
1000.0000 mg | INTRAVENOUS | Status: AC
  Administered 2019-10-05: 1000 mg via INTRAVENOUS
  Filled 2019-10-05: qty 100

## 2019-10-05 MED ORDER — ACETAMINOPHEN 325 MG PO TABS: 325.0000 mg | ORAL_TABLET | Freq: Four times a day (QID) | ORAL | Status: DC | PRN

## 2019-10-05 MED ORDER — OXYCODONE HCL 5 MG PO TABS: 5.0000 mg | ORAL_TABLET | Freq: Once | ORAL | Status: DC | PRN

## 2019-10-05 MED ORDER — HYDROCODONE-ACETAMINOPHEN 5-325 MG PO TABS
1.0000 | ORAL_TABLET | ORAL | Status: DC | PRN
  Administered 2019-10-05 – 2019-10-06 (×2): 2 via ORAL
  Filled 2019-10-05 (×2): qty 2

## 2019-10-05 MED ORDER — PROPOFOL 10 MG/ML IV BOLUS
INTRAVENOUS | Status: DC | PRN
  Administered 2019-10-05 (×4): 20 mg via INTRAVENOUS

## 2019-10-05 MED ORDER — CHLORHEXIDINE GLUCONATE 4 % EX LIQD: 60.0000 mL | Freq: Once | CUTANEOUS | Status: DC

## 2019-10-05 MED ORDER — CELECOXIB 200 MG PO CAPS
200.0000 mg | ORAL_CAPSULE | Freq: Two times a day (BID) | ORAL | Status: DC
  Administered 2019-10-05 – 2019-10-06 (×2): 200 mg via ORAL
  Filled 2019-10-05 (×2): qty 1

## 2019-10-05 MED ORDER — MIDAZOLAM HCL 2 MG/2ML IJ SOLN
INTRAMUSCULAR | Status: DC | PRN
  Administered 2019-10-05 (×2): 1 mg via INTRAVENOUS

## 2019-10-05 MED ORDER — BUPIVACAINE IN DEXTROSE 0.75-8.25 % IT SOLN
INTRATHECAL | Status: DC | PRN
  Administered 2019-10-05: 1.6 mL via INTRATHECAL

## 2019-10-05 MED ORDER — METHOCARBAMOL 500 MG PO TABS
500.0000 mg | ORAL_TABLET | Freq: Four times a day (QID) | ORAL | Status: DC | PRN
  Administered 2019-10-06: 500 mg via ORAL
  Filled 2019-10-05: qty 1

## 2019-10-05 MED ORDER — HYDROCODONE-ACETAMINOPHEN 7.5-325 MG PO TABS: 1.0000 | ORAL_TABLET | ORAL | Status: DC | PRN

## 2019-10-05 MED ORDER — SODIUM CHLORIDE 0.9 % IR SOLN
Status: DC | PRN
  Administered 2019-10-05: 1

## 2019-10-05 MED ORDER — ONDANSETRON HCL 4 MG/2ML IJ SOLN
INTRAMUSCULAR | Status: DC | PRN
  Administered 2019-10-05: 4 mg via INTRAVENOUS

## 2019-10-05 MED ORDER — FENTANYL CITRATE (PF) 250 MCG/5ML IJ SOLN
INTRAMUSCULAR | Status: DC | PRN
  Administered 2019-10-05 (×4): 25 ug via INTRAVENOUS
  Administered 2019-10-05: 50 ug via INTRAVENOUS
  Administered 2019-10-05 (×2): 25 ug via INTRAVENOUS
  Administered 2019-10-05: 50 ug via INTRAVENOUS

## 2019-10-05 MED ORDER — POVIDONE-IODINE 10 % EX SWAB
2.0000 "application " | Freq: Once | CUTANEOUS | Status: AC
  Administered 2019-10-05: 2 via TOPICAL

## 2019-10-05 SURGICAL SUPPLY — 51 items
ADH SKN CLS APL DERMABOND .7 (GAUZE/BANDAGES/DRESSINGS) ×1
BAG DECANTER FOR FLEXI CONT (MISCELLANEOUS) IMPLANT
BAG SPEC THK2 15X12 ZIP CLS (MISCELLANEOUS)
BAG ZIPLOCK 12X15 (MISCELLANEOUS) IMPLANT
BLADE SAG 18X100X1.27 (BLADE) ×2 IMPLANT
BLADE SURG SZ10 CARB STEEL (BLADE) ×4 IMPLANT
COVER PERINEAL POST (MISCELLANEOUS) ×2 IMPLANT
COVER SURGICAL LIGHT HANDLE (MISCELLANEOUS) ×2 IMPLANT
COVER WAND RF STERILE (DRAPES) ×1 IMPLANT
CUP ACETBLR 52 OD PINNACLE (Hips) ×1 IMPLANT
DERMABOND ADVANCED (GAUZE/BANDAGES/DRESSINGS) ×1
DERMABOND ADVANCED .7 DNX12 (GAUZE/BANDAGES/DRESSINGS) ×1 IMPLANT
DRAPE 3/4 80X56 (DRAPES) ×1 IMPLANT
DRAPE STERI IOBAN 125X83 (DRAPES) ×2 IMPLANT
DRAPE U-SHAPE 47X51 STRL (DRAPES) ×4 IMPLANT
DRESSING AQUACEL AG SP 3.5X10 (GAUZE/BANDAGES/DRESSINGS) ×1 IMPLANT
DRSG AQUACEL AG SP 3.5X10 (GAUZE/BANDAGES/DRESSINGS) ×2
DURAPREP 26ML APPLICATOR (WOUND CARE) ×2 IMPLANT
ELECT BLADE TIP CTD 4 INCH (ELECTRODE) ×2 IMPLANT
ELECT REM PT RETURN 15FT ADLT (MISCELLANEOUS) ×2 IMPLANT
ELIMINATOR HOLE APEX DEPUY (Hips) ×1 IMPLANT
GLOVE BIO SURGEON STRL SZ 6 (GLOVE) ×5 IMPLANT
GLOVE BIO SURGEON STRL SZ 6.5 (GLOVE) ×1 IMPLANT
GLOVE BIO SURGEON STRL SZ8 (GLOVE) ×1 IMPLANT
GLOVE BIOGEL M 7.0 STRL (GLOVE) ×1 IMPLANT
GLOVE BIOGEL PI IND STRL 6.5 (GLOVE) ×1 IMPLANT
GLOVE BIOGEL PI IND STRL 7.5 (GLOVE) ×1 IMPLANT
GLOVE BIOGEL PI IND STRL 8.5 (GLOVE) ×1 IMPLANT
GLOVE BIOGEL PI INDICATOR 6.5 (GLOVE) ×1
GLOVE BIOGEL PI INDICATOR 7.5 (GLOVE) ×1
GLOVE BIOGEL PI INDICATOR 8.5 (GLOVE) ×1
GLOVE ECLIPSE 8.0 STRL XLNG CF (GLOVE) ×4 IMPLANT
GLOVE ORTHO TXT STRL SZ7.5 (GLOVE) ×4 IMPLANT
GOWN STRL REUS W/TWL LRG LVL3 (GOWN DISPOSABLE) ×4 IMPLANT
GOWN STRL REUS W/TWL XL LVL3 (GOWN DISPOSABLE) ×2 IMPLANT
HEAD CERAMIC DELTA 36 PLUS 1.5 (Hips) ×1 IMPLANT
HOLDER FOLEY CATH W/STRAP (MISCELLANEOUS) ×2 IMPLANT
KIT TURNOVER KIT A (KITS) IMPLANT
LINER NEUTRAL 52X36MM PLUS 4 (Liner) ×1 IMPLANT
PACK ANTERIOR HIP CUSTOM (KITS) ×2 IMPLANT
SCREW 6.5MMX30MM (Screw) ×1 IMPLANT
STEM FEMORAL SZ5 HIGH ACTIS (Stem) ×1 IMPLANT
SUT MNCRL AB 4-0 PS2 18 (SUTURE) ×2 IMPLANT
SUT STRATAFIX 0 PDS 27 VIOLET (SUTURE) ×2
SUT VIC AB 1 CT1 36 (SUTURE) ×6 IMPLANT
SUT VIC AB 2-0 CT1 27 (SUTURE) ×4
SUT VIC AB 2-0 CT1 TAPERPNT 27 (SUTURE) ×2 IMPLANT
SUTURE STRATFX 0 PDS 27 VIOLET (SUTURE) ×1 IMPLANT
TRAY FOLEY MTR SLVR 16FR STAT (SET/KITS/TRAYS/PACK) IMPLANT
WATER STERILE IRR 1000ML POUR (IV SOLUTION) ×2 IMPLANT
YANKAUER SUCT BULB TIP 10FT TU (MISCELLANEOUS) IMPLANT

## 2019-10-05 NOTE — Anesthesia Procedure Notes (Addendum)
Spinal  Patient location during procedure: OR Start time: 10/05/2019 8:47 AM End time: 10/05/2019 8:50 AM Staffing Anesthesiologist: Audry Pili, MD Performed: anesthesiologist  Preanesthetic Checklist Completed: patient identified, surgical consent, pre-op evaluation, timeout performed, IV checked, risks and benefits discussed and monitors and equipment checked Spinal Block Patient position: sitting Prep: DuraPrep Patient monitoring: heart rate, cardiac monitor, continuous pulse ox and blood pressure Approach: midline Location: L2-3 Injection technique: single-shot Needle Needle type: Pencan  Needle gauge: 24 G Additional Notes Consent was obtained prior to the procedure with all questions answered and concerns addressed. Risks including, but not limited to, bleeding, infection, nerve damage, paralysis, failed block, inadequate analgesia, allergic reaction, high spinal, itching, and headache were discussed and the patient wished to proceed. Functioning IV was confirmed and monitors were applied. Sterile prep and drape, including hand hygiene, mask, and sterile gloves were used. The patient was positioned and the spine was prepped. The skin was anesthetized with lidocaine. Free flow of clear CSF was obtained prior to injecting local anesthetic into the CSF. The spinal needle aspirated freely following injection. The needle was carefully withdrawn. The patient tolerated the procedure well.   Renold Don, MD

## 2019-10-05 NOTE — Anesthesia Postprocedure Evaluation (Signed)
Anesthesia Post Note  Patient: Lisa Oliver  Procedure(s) Performed: TOTAL HIP ARTHROPLASTY ANTERIOR APPROACH (Right Hip)     Patient location during evaluation: PACU Anesthesia Type: Spinal Level of consciousness: awake and alert Pain management: pain level controlled Vital Signs Assessment: post-procedure vital signs reviewed and stable Respiratory status: spontaneous breathing and respiratory function stable Cardiovascular status: blood pressure returned to baseline and stable Postop Assessment: spinal receding and no apparent nausea or vomiting Anesthetic complications: no    Last Vitals:  Vitals:   10/05/19 1130 10/05/19 1149  BP: 131/64 (!) 142/78  Pulse: 64 (!) 58  Resp: 16 15  Temp: (!) 36.1 C   SpO2: 100% 100%    Last Pain:  Vitals:   10/05/19 1130  TempSrc:   PainSc: 0-No pain                 Audry Pili

## 2019-10-05 NOTE — Discharge Instructions (Signed)

## 2019-10-05 NOTE — Anesthesia Procedure Notes (Signed)
Procedure Name: MAC Date/Time: 10/05/2019 8:42 AM Performed by: Cynda Familia, CRNA Pre-anesthesia Checklist: Patient identified, Emergency Drugs available, Suction available, Patient being monitored and Timeout performed Patient Re-evaluated:Patient Re-evaluated prior to induction Oxygen Delivery Method: Simple face mask Placement Confirmation: positive ETCO2 and breath sounds checked- equal and bilateral Dental Injury: Teeth and Oropharynx as per pre-operative assessment

## 2019-10-05 NOTE — Interval H&P Note (Signed)
History and Physical Interval Note:  10/05/2019 7:08 AM  Lisa Oliver  has presented today for surgery, with the diagnosis of Right hip osteoarthritis.  The various methods of treatment have been discussed with the patient and family. After consideration of risks, benefits and other options for treatment, the patient has consented to  Procedure(s) with comments: TOTAL HIP ARTHROPLASTY ANTERIOR APPROACH (Right) - 70 mins Requests am surgery time as a surgical intervention.  The patient's history has been reviewed, patient examined, no change in status, stable for surgery.  I have reviewed the patient's chart and labs.  Questions were answered to the patient's satisfaction.     Mauri Pole

## 2019-10-05 NOTE — Op Note (Signed)
NAME:  Lisa Oliver                ACCOUNT NO.: [id number]      MEDICAL RECORD NO.: [id number]      FACILITY:  [facility]      PHYSICIAN:  Mauri Pole  DATE OF BIRTH:  04-12-1951     DATE OF PROCEDURE:  10/05/2019                                 OPERATIVE REPORT         PREOPERATIVE DIAGNOSIS: Right  hip osteoarthritis.      POSTOPERATIVE DIAGNOSIS:  Right hip osteoarthritis.      PROCEDURE:  Right total hip replacement through an anterior approach   utilizing DePuy THR system, component size 57mm pinnacle cup, a size 36+4 neutral   Altrex liner, a size 5 Hi Actis stem with a 36+1.5 delta ceramic   ball.      SURGEON:  Pietro Cassis. Alvan Dame, M.D.      ASSISTANT:  Griffith Citron, PA-C     ANESTHESIA:  Spinal.      SPECIMENS:  None.      COMPLICATIONS:  None.      BLOOD LOSS:  650 cc     DRAINS:  None.      INDICATION OF THE PROCEDURE:  Lisa Oliver is a 68 y.o. female who had   presented to office for evaluation of right hip pain.  Radiographs revealed   progressive degenerative changes with bone-on-bone   articulation of the  hip joint, including subchondral cystic changes and osteophytes.  The patient had painful limited range of   motion significantly affecting their overall quality of life and function.  The patient was failing to    respond to conservative measures including medications and/or injections and activity modification and at this point was ready   to proceed with more definitive measures.  Consent was obtained for   benefit of pain relief.  Specific risks of infection, DVT, component   failure, dislocation, neurovascular injury, and need for revision surgery were reviewed in the office as well discussion of   the anterior versus posterior approach were reviewed.     PROCEDURE IN DETAIL:  The patient was brought to operative theater.   Once adequate anesthesia, preoperative antibiotics, 2 gm of Ancef, 1 gm of Tranexamic Acid, and 10  mg of Decadron were administered, the patient was positioned supine on the [employer] table.  Once the patient was safely positioned with adequate padding of boney prominences we predraped out the hip, and used fluoroscopy to confirm orientation of the pelvis.      The right hip was then prepped and draped from proximal iliac crest to   mid thigh with a shower curtain technique.      Time-out was performed identifying the patient, planned procedure, and the appropriate extremity.     An incision was then made 2 cm lateral to the   anterior superior iliac spine extending over the orientation of the   tensor fascia lata muscle and sharp dissection was carried down to the   fascia of the muscle.      The fascia was then incised.  The muscle belly was identified and swept   laterally and retractor placed along the superior neck.  Following   cauterization of the circumflex vessels and removing some pericapsular  fat, a second cobra retractor was placed on the inferior neck.  A T-capsulotomy was made along the line of the   superior neck to the trochanteric fossa, then extended proximally and   distally.  Tag sutures were placed and the retractors were then placed   intracapsular.  We then identified the trochanteric fossa and   orientation of my neck cut and then made a neck osteotomy with the femur on traction.  The femoral   head was removed without difficulty or complication.  Traction was let   off and retractors were placed posterior and anterior around the   acetabulum.      The labrum and foveal tissue were debrided.  I began reaming with a 47 mm   reamer and reamed up to 51 mm reamer with good bony bed preparation and a 52 mm  cup was chosen.  The final 52 mm Pinnacle cup was then impacted under fluoroscopy to confirm the depth of penetration and orientation with respect to   Abduction and forward flexion.  A screw was placed into the ilium followed by the hole eliminator.  The final    36+4 neutral Altrex liner was impacted with good visualized rim fit.  The cup was positioned anatomically within the acetabular portion of the pelvis.      At this point, the femur was rolled to 100 degrees.  Further capsule was   released off the inferior aspect of the femoral neck.  I then   released the superior capsule proximally.  With the leg in a neutral position the hook was placed laterally   along the femur under the vastus lateralis origin and elevated manually and then held in position using the hook attachment on the bed.  The leg was then extended and adducted with the leg rolled to 100   degrees of external rotation.  Retractors were placed along the medial calcar and posteriorly over the greater trochanter.  Once the proximal femur was fully   exposed, I used a box osteotome to set orientation.  I then began   broaching with the starting chili pepper broach and passed this by hand and then broached up to 5.  With the 5 broach in place I chose a high offset neck and did several trial reductions.  The offset was appropriate, leg lengths   appeared to be equal best matched with the +1.5 (v the +5) head ball trial confirmed radiographically.   Given these findings, I went ahead and dislocated the hip, repositioned all   retractors and positioned the right hip in the extended and abducted position.  The final 5 Hi Actis stem was   chosen and it was impacted down to the level of neck cut.  Based on this   and the trial reductions, a final 36+1.5 delta ceramic ball was chosen and   impacted onto a clean and dry trunnion, and the hip was reduced.  The   hip had been irrigated throughout the case again at this point.  I did   reapproximate the superior capsular leaflet to the anterior leaflet   using #1 Vicryl.  The fascia of the   tensor fascia lata muscle was then reapproximated using #1 Vicryl and #0 Stratafix sutures.  The   remaining wound was closed with 2-0 Vicryl and running 4-0  Monocryl.   The hip was cleaned, dried, and dressed sterilely using Dermabond and   Aquacel dressing.  The patient was then brought   to  recovery room in stable condition tolerating the procedure well.    Griffith Citron, PA-C was present for the entirety of the case involved from   preoperative positioning, perioperative retractor management, general   facilitation of the case, as well as primary wound closure as assistant.            Pietro Cassis Alvan Dame, M.D.        10/05/2019 10:14 AM

## 2019-10-05 NOTE — Evaluation (Signed)
Physical Therapy Evaluation Patient Details Name: Lisa Oliver MRN: [id number] DOB: [birth year] Today's Date: 10/05/2019   History of Present Illness  Patient is 68 y.o. female s/p Rt THA anterior approach on 10/05/19 with no significant PMH.  Clinical Impression  Lisa Oliver is a 68 y.o. female POD 0 s/p Rt THA anterior approach. Patient reports independence with mobility at baseline. Patient is now limited by functional impairments (see PT problem list below) and requires min assist for transfers and gait with RW. Patient was able to ambulate ~25 feet with RW and was limited by nausea and lightheadedness, pt found to be hypotensive after being provided seated rest and was reclined with LE's elevated. BP improved after being reclined for several minutes. RN notified. Patient instructed in exercise to facilitate ROM and circulation to manage edema. Patient will benefit from continued skilled PT interventions to address impairments and progress towards PLOF. Acute PT will follow to progress mobility and stair training in preparation for safe discharge home.     Follow Up Recommendations Follow surgeon's recommendation for DC plan and follow-up therapies    Equipment Recommendations  Rolling walker with 5" wheels;3in1 (PT)    Recommendations for Other Services       Precautions / Restrictions Precautions Precautions: Fall Restrictions Weight Bearing Restrictions: No      Mobility  Bed Mobility Overal bed mobility: Needs Assistance Bed Mobility: Supine to Sit     Supine to sit: Supervision     General bed mobility comments: cues required for sequencing and pt using bed rails, pt using UE's to assist with Rt LE mobility  Transfers Overall transfer level: Needs assistance Equipment used: Rolling walker (2 wheeled) Transfers: Sit to/from Stand Sit to Stand: Min assist         General transfer comment: cues for safe hand placement and technique required, assist to initate  power up required and to steady with rising  Ambulation/Gait Ambulation/Gait assistance: Min assist Gait Distance (Feet): 25 Feet(distance limited by nausea and lightheadedness) Assistive device: Rolling walker (2 wheeled) Gait Pattern/deviations: Step-to pattern;Decreased step length - left;Decreased stance time - right;Decreased stride length;Antalgic Gait velocity: decreased   General Gait Details: pt with slow step to pattern and requiring cues for safe hand placement and assist to maintain safe proximity to RW, no overt LOB noted during gait. pt limited by nausea and lightheadedness. pt found to be hypotensive with seated rest break, pressure improved with reclined position.  Stairs            Wheelchair Mobility    Modified Rankin (Stroke Patients Only)       Balance Overall balance assessment: Needs assistance Sitting-balance support: No upper extremity supported;Feet supported Sitting balance-Leahy Scale: Good     Standing balance support: During functional activity;Bilateral upper extremity supported Standing balance-Leahy Scale: Poor                      Pertinent Vitals/Pain Pain Assessment: Faces Faces Pain Scale: Hurts a little bit Pain Location: Rt hip Pain Descriptors / Indicators: Burning;Tightness Pain Intervention(s): Limited activity within patient's tolerance;Monitored during session;Repositioned;Ice applied    Home Living Family/patient expects to be discharged to:: Private residence Living Arrangements: Spouse/significant other Available Help at Discharge: Family;Available 24 hours/day Type of Home: House Home Access: Stairs to enter Entrance Stairs-Rails: None;Right;Left Entrance Stairs-Number of Steps: 4 steps at front wihtout hand rails, and 5 steps at back (railing on both sides for first 3 steps, then a porch, then  2 more step no rails) Home Layout: One level Home Equipment: Grab bars - tub/shower;Shower seat - built in       Prior Function Level of Independence: Independent               Hand Dominance   Dominant Hand: Right    Extremity/Trunk Assessment   Upper Extremity Assessment Upper Extremity Assessment: Overall WFL for tasks assessed    Lower Extremity Assessment Lower Extremity Assessment: RLE deficits/detail RLE Deficits / Details: pt with good quad activation and 4/5 for LAQ resisted testing in sitting RLE Sensation: WNL RLE Coordination: WNL    Cervical / Trunk Assessment Cervical / Trunk Assessment: Normal  Communication   Communication: No difficulties  Cognition Arousal/Alertness: Awake/alert Behavior During Therapy: WFL for tasks assessed/performed Overall Cognitive Status: Within Functional Limits for tasks assessed             General Comments      Exercises Total Joint Exercises Ankle Circles/Pumps: AROM;10 reps;Seated;Both Quad Sets: AROM;10 reps;Supine;Right Heel Slides: AROM;10 reps;Supine;Right   Assessment/Plan    PT Assessment Patient needs continued PT services  PT Problem List Decreased range of motion;Decreased strength;Decreased balance;Decreased mobility;Decreased knowledge of use of DME;Decreased activity tolerance       PT Treatment Interventions DME instruction;Functional mobility training;Balance training;Patient/family education;Modalities;Therapeutic activities;Stair training;Therapeutic exercise;Gait training    PT Goals (Current goals can be found in the Care Plan section)  Acute Rehab PT Goals Patient Stated Goal: pt wants to walk better with no device again PT Goal Formulation: With patient Time For Goal Achievement: 10/12/19 Potential to Achieve Goals: Good    Frequency 7X/week    AM-PAC PT "6 Clicks" Mobility  Outcome Measure Help needed turning from your back to your side while in a flat bed without using bedrails?: A Little Help needed moving from lying on your back to sitting on the side of a flat bed without using  bedrails?: A Little Help needed moving to and from a bed to a chair (including a wheelchair)?: A Little Help needed standing up from a chair using your arms (e.g., wheelchair or bedside chair)?: A Little Help needed to walk in hospital room?: A Little Help needed climbing 3-5 steps with a railing? : A Lot 6 Click Score: 17    End of Session Equipment Utilized During Treatment: Gait belt Activity Tolerance: Treatment limited secondary to medical complications (Comment)(symptomatic hypotension after gait) Patient left: in chair;with call bell/phone within reach;with nursing/sitter in room;with chair alarm set Nurse Communication: Mobility status PT Visit Diagnosis: Muscle weakness (generalized) (M62.81);Difficulty in walking, not elsewhere classified (R26.2)    Time: [id number] PT Time Calculation (min) (ACUTE ONLY): 39 min   Charges:   PT Evaluation $PT Eval Low Complexity: 1 Low PT Treatments $Therapeutic Exercise: 8-22 mins        Kipp Brood, PT, DPT Physical Therapist with [facility]  10/05/2019 6:05 PM

## 2019-10-05 NOTE — Transfer of Care (Signed)
Immediate Anesthesia Transfer of Care Note  Patient: Lisa Oliver  Procedure(s) Performed: TOTAL HIP ARTHROPLASTY ANTERIOR APPROACH (Right Hip)  Patient Location: PACU  Anesthesia Type:Spinal  Level of Consciousness: awake and alert   Airway & Oxygen Therapy: Patient Spontanous Breathing and Patient connected to face mask oxygen  Post-op Assessment: Report given to RN and Post -op Vital signs reviewed and stable  Post vital signs: Reviewed and stable  Last Vitals:  Vitals Value Taken Time  BP 122/61 10/05/19 1039  Temp    Pulse 61 10/05/19 1043  Resp 3 10/05/19 1043  SpO2 100 % 10/05/19 1043  Vitals shown include unvalidated device data.  Last Pain:  Vitals:   10/05/19 0636  TempSrc:   PainSc: 3       Patients Stated Pain Goal: 4 ([id number])  Complications: No apparent anesthesia complications

## 2019-10-05 NOTE — Progress Notes (Signed)
Patient ambulated in halls w/ therapy. Began feeling nauseated and clammy. Patient was assisted to chair. Noted patient was pale. VS checked. BP 65/35. Patient did not lose consciousness, only reported nausea, dizziness and feeling clammy. Reclined patient's head, elevated legs. Patient began feeling better, no nausea or dizziness. VS rechecked, 94/42 pulse 67. Encouraged patient to rest in reclined position and perform ankle pumps but no other exercises. Will cont to monitor.

## 2019-10-05 NOTE — Anesthesia Preprocedure Evaluation (Addendum)
Anesthesia Evaluation  Patient identified by MRN, date of birth, ID band Patient awake    Reviewed: Allergy & Precautions, NPO status , Patient's Chart, lab work & pertinent test results  History of Anesthesia Complications Negative for: history of anesthetic complications  Airway Mallampati: II  TM Distance: >3 FB Neck ROM: Full    Dental  (+) Dental Advisory Given   Pulmonary neg pulmonary ROS,    Pulmonary exam normal        Cardiovascular negative cardio ROS Normal cardiovascular exam     Neuro/Psych negative neurological ROS  negative psych ROS   GI/Hepatic negative GI ROS, Neg liver ROS,   Endo/Other  Hypothyroidism   Renal/GU negative Renal ROS     Musculoskeletal  (+) Arthritis ,   Abdominal   Peds  Hematology negative hematology ROS (+)   Anesthesia Other Findings   Reproductive/Obstetrics                           Anesthesia Physical Anesthesia Plan  ASA: II  Anesthesia Plan: Spinal   Post-op Pain Management:    Induction:   PONV Risk Score and Plan: 2 and Treatment may vary due to age or medical condition and Propofol infusion  Airway Management Planned: Natural Airway and Simple Face Mask  Additional Equipment: None  Intra-op Plan:   Post-operative Plan:   Informed Consent: I have reviewed the patients History and Physical, chart, labs and discussed the procedure including the risks, benefits and alternatives for the proposed anesthesia with the patient or authorized representative who has indicated his/her understanding and acceptance.       Plan Discussed with: CRNA and Anesthesiologist  Anesthesia Plan Comments: (Labs reviewed, platelets acceptable. Discussed risks and benefits of spinal, including spinal/epidural hematoma, infection, failed block, and PDPH. Patient expressed understanding and wished to proceed. )      Anesthesia Quick Evaluation

## 2019-10-06 ENCOUNTER — Encounter (HOSPITAL_COMMUNITY): Payer: Self-pay | Admitting: Orthopedic Surgery

## 2019-10-06 DIAGNOSIS — E663 Overweight: Secondary | ICD-10-CM | POA: Diagnosis present

## 2019-10-06 LAB — BASIC METABOLIC PANEL
Anion gap: 8 (ref 5–15)
BUN: 13 mg/dL (ref 8–23)
CO2: 25 mmol/L (ref 22–32)
Calcium: 8.7 mg/dL — ABNORMAL LOW (ref 8.9–10.3)
Chloride: 101 mmol/L (ref 98–111)
Creatinine, Ser: 0.71 mg/dL (ref 0.44–1.00)
GFR calc Af Amer: >60 mL/min (ref >60)
GFR calc non Af Amer: >60 mL/min (ref >60)
Glucose, Bld: 150 mg/dL — ABNORMAL HIGH (ref 70–99)
Potassium: 4.2 mmol/L (ref 3.5–5.1)
Sodium: 134 mmol/L — ABNORMAL LOW (ref 135–145)

## 2019-10-06 LAB — CBC
HCT: 33.8 % — ABNORMAL LOW (ref 36.0–46.0)
Hemoglobin: 10.8 g/dL — ABNORMAL LOW (ref 12.0–15.0)
MCH: 28.6 pg (ref 26.0–34.0)
MCHC: 32 g/dL (ref 30.0–36.0)
MCV: 89.4 fL (ref 80.0–100.0)
Platelets: 246 10*3/uL (ref 150–400)
RBC: 3.78 MIL/uL — ABNORMAL LOW (ref 3.87–5.11)
RDW: 14 % (ref 11.5–15.5)
WBC: 11.2 10*3/uL — ABNORMAL HIGH (ref 4.0–10.5)
nRBC: 0 % (ref 0.0–0.2)

## 2019-10-06 MED ORDER — FERROUS SULFATE 325 (65 FE) MG PO TABS: 325.0000 mg | ORAL_TABLET | Freq: Three times a day (TID) | ORAL | 0 refills | Status: DC

## 2019-10-06 MED ORDER — POLYETHYLENE GLYCOL 3350 17 G PO PACK: 17.0000 g | PACK | Freq: Two times a day (BID) | ORAL | 0 refills | Status: DC

## 2019-10-06 MED ORDER — DOCUSATE SODIUM 100 MG PO CAPS: 100.0000 mg | ORAL_CAPSULE | Freq: Two times a day (BID) | ORAL | 0 refills | Status: DC

## 2019-10-06 MED ORDER — ASPIRIN 81 MG PO CHEW: 81.0000 mg | CHEWABLE_TABLET | Freq: Two times a day (BID) | ORAL | 0 refills | Status: AC

## 2019-10-06 MED ORDER — HYDROCODONE-ACETAMINOPHEN 5-325 MG PO TABS: 1.0000 | ORAL_TABLET | ORAL | 0 refills | Status: DC | PRN

## 2019-10-06 MED ORDER — METHOCARBAMOL 500 MG PO TABS: 500.0000 mg | ORAL_TABLET | Freq: Four times a day (QID) | ORAL | 0 refills | Status: DC | PRN

## 2019-10-06 NOTE — Plan of Care (Signed)
  Problem: Activity: Goal: Risk for activity intolerance will decrease Outcome: Progressing   Problem: Pain Managment: Goal: General experience of comfort will improve Outcome: Progressing   Problem: Safety: Goal: Ability to remain free from injury will improve Outcome: Progressing   Problem: Education: Goal: Knowledge of the prescribed therapeutic regimen will improve Outcome: Progressing Goal: Understanding of discharge needs will improve Outcome: Progressing

## 2019-10-06 NOTE — Progress Notes (Signed)
Physical Therapy Treatment Patient Details Name: Lisa Oliver MRN: [id number] DOB: [birth year] Today's Date: 10/06/2019    History of Present Illness Patient is 68 y.o. female s/p Rt THA anterior approach on 10/05/19 with no significant PMH.    PT Comments    Pt was eager to try the stairs again with husband present. Pt was able to get OOB and ambulate with minimal VC's. General bed mobility comments: OOB in recliner General transfer comment: Pt was able to stand with minimal VC's to push off the recliner General Gait Details: Pt was able to ambulate with no increase in pain or discomfort General stair comments: Pt husband was present for stairs and demonstrated that he was able to help her up the stairs. Husband required minimal VC's for safety on stairs   Follow Up Recommendations  Follow surgeon's recommendation for DC plan and follow-up therapies     Equipment Recommendations  Rolling walker with 5" wheels;3in1 (PT)    Recommendations for Other Services       Precautions / Restrictions Precautions Precautions: Fall Restrictions Weight Bearing Restrictions: No    Mobility  Bed Mobility Overal bed mobility: Modified Independent Bed Mobility: Supine to Sit     Supine to sit: Supervision     General bed mobility comments: OOB in recliner  Transfers Overall transfer level: Modified independent Equipment used: Rolling walker (2 wheeled) Transfers: Sit to/from Stand Sit to Stand: Min guard         General transfer comment: Pt was able to stand with minimal VC's to push off the recliner  Ambulation/Gait Ambulation/Gait assistance: Min guard Gait Distance (Feet): 30 Feet Assistive device: Rolling walker (2 wheeled) Gait Pattern/deviations: Step-through pattern;Decreased stride length     General Gait Details: Pt was able to ambulate with no increase in pain or discomfort   Stairs Stairs: Yes Stairs assistance: Min assist;Min guard Stair Management: No  rails;Step to pattern;Forwards;With walker Number of Stairs: 3 General stair comments: Pt husband was present for stairs and demonstrated that he was able to help her up the stairs. Husband required minimal VC's for safety on stairs   Wheelchair Mobility    Modified Rankin (Stroke Patients Only)       Balance                                            Cognition Arousal/Alertness: Awake/alert Behavior During Therapy: WFL for tasks assessed/performed Overall Cognitive Status: Within Functional Limits for tasks assessed                                        Exercises    General Comments        Pertinent Vitals/Pain Pain Assessment: 0-10 Pain Score: 1  Pain Location: Rt hip Pain Descriptors / Indicators: Aching Pain Intervention(s): Monitored during session;Repositioned    Home Living                      Prior Function            PT Goals (current goals can now be found in the care plan section)      Frequency           PT Plan Current plan remains appropriate    Co-evaluation  AM-PAC PT "6 Clicks" Mobility   Outcome Measure  Help needed turning from your back to your side while in a flat bed without using bedrails?: A Little Help needed moving from lying on your back to sitting on the side of a flat bed without using bedrails?: A Little Help needed moving to and from a bed to a chair (including a wheelchair)?: A Little Help needed standing up from a chair using your arms (e.g., wheelchair or bedside chair)?: A Little Help needed to walk in hospital room?: A Little Help needed climbing 3-5 steps with a railing? : A Lot 6 Click Score: 17    End of Session Equipment Utilized During Treatment: Gait belt Activity Tolerance: Patient tolerated treatment well Patient left: in chair;with call bell/phone within reach;with chair alarm set Nurse Communication: Mobility status PT Visit Diagnosis:  Muscle weakness (generalized) (M62.81);Difficulty in walking, not elsewhere classified (R26.2)     Time: [id number] PT Time Calculation (min) (ACUTE ONLY): 18 min  Charges:  $Gait Training: 8-22 mins $Therapeutic Activity: 8-22 mins                     Excell Seltzer, George Acute Rehab

## 2019-10-06 NOTE — Progress Notes (Signed)
Physical Therapy Treatment Patient Details Name: Lisa Oliver MRN: [id number] DOB: 1951-12-01 Today's Date: 10/06/2019    History of Present Illness Patient is 68 y.o. female s/p Rt THA anterior approach on 10/05/19 with no significant PMH.    PT Comments    Pt demonstrated good motivation today to try stairs because she wants to go home. Pt had no increase in pain while doing functional activities.General bed mobility comments: OOB in recliner General transfer comment: Minimal VC's needed to push up off of recliner and not hold onto the RW General Gait Details: Pt was able to ambulate with minimal assistance VC's need when turning to stay close to walker for safety General Gait Details: Pt was able to ambulate with minimal assistance VC's need when turning to stay close to walker for safety General stair comments: Pt was able to ambulate stairs with assistance. Moderate VC's required to "go up with the good and down with the bad" Pt needs family member present to help ambulate stairs safely   Follow Up Recommendations  Follow surgeon's recommendation for DC plan and follow-up therapies     Equipment Recommendations  Rolling walker with 5" wheels;3in1 (PT)    Recommendations for Other Services       Precautions / Restrictions Precautions Precautions: Fall Restrictions Weight Bearing Restrictions: No    Mobility  Bed Mobility Overal bed mobility: Modified Independent Bed Mobility: Supine to Sit     Supine to sit: Supervision     General bed mobility comments: OOB in recliner  Transfers Overall transfer level: Modified independent Equipment used: Rolling walker (2 wheeled) Transfers: Sit to/from Stand Sit to Stand: Min guard         General transfer comment: Minimal VC's needed to push up off of recliner and not hold onto the RW  Ambulation/Gait Ambulation/Gait assistance: Min guard Gait Distance (Feet): 50 Feet Assistive device: Rolling walker (2 wheeled) Gait  Pattern/deviations: Step-through pattern;Decreased stride length     General Gait Details: Pt was able to ambulate with minimal assistance VC's need when turning to stay close to walker for safety   Stairs Stairs: Yes Stairs assistance: Min assist;Min guard Stair Management: No rails;Step to pattern;Forwards;With walker Number of Stairs: 3 General stair comments: Pt was able to ambulate stairs with assistance. Moderate VC's required to "go up with the good and down with the bad" Pt needs family member present to help ambulate stairs safely   Wheelchair Mobility    Modified Rankin (Stroke Patients Only)       Balance                                            Cognition Arousal/Alertness: Awake/alert Behavior During Therapy: WFL for tasks assessed/performed Overall Cognitive Status: Within Functional Limits for tasks assessed                                        Exercises    General Comments        Pertinent Vitals/Pain Pain Assessment: 0-10 Pain Score: 1  Pain Location: Rt hip Pain Descriptors / Indicators: Aching Pain Intervention(s): Monitored during session;Repositioned;Ice applied    Home Living                      Prior  Function            PT Goals (current goals can now be found in the care plan section)      Frequency           PT Plan Current plan remains appropriate    Co-evaluation              AM-PAC PT "6 Clicks" Mobility   Outcome Measure  Help needed turning from your back to your side while in a flat bed without using bedrails?: A Little Help needed moving from lying on your back to sitting on the side of a flat bed without using bedrails?: A Little Help needed moving to and from a bed to a chair (including a wheelchair)?: A Little Help needed standing up from a chair using your arms (e.g., wheelchair or bedside chair)?: A Little Help needed to walk in hospital room?: A  Little Help needed climbing 3-5 steps with a railing? : A Lot 6 Click Score: 17    End of Session Equipment Utilized During Treatment: Gait belt Activity Tolerance: Patient tolerated treatment well Patient left: in chair;with call bell/phone within reach;with chair alarm set Nurse Communication: Mobility status PT Visit Diagnosis: Muscle weakness (generalized) (M62.81);Difficulty in walking, not elsewhere classified (R26.2)     Time: 1340-1405 PT Time Calculation (min) (ACUTE ONLY): 25 min  Charges:  $Gait Training: 8-22 mins $Therapeutic Activity: 8-22 mins                     Excell Seltzer, [facility]

## 2019-10-06 NOTE — Progress Notes (Addendum)
Physical Therapy Treatment Patient Details Name: Lisa Oliver MRN: [id number] DOB: 07-15-1951 Today's Date: 10/06/2019    History of Present Illness Patient is 68 y.o. female s/p Rt THA anterior approach on 10/05/19 with no significant PMH.    PT Comments    Pt was very motivated for today's session. She expressed concern about becoming nauseous again. General bed mobility comments: Pt required minimal VC's to use bed rails when sitting up, also needed VC's for propper use of gait belt to help lift leg to sit on EOB General transfer comment: moderate VC's need to use hands flat on the bed to push up to standing General Gait Details: Pt was almost at normal speed while walking for 50 ft, VC's needed for propper technique with RW. No LOB, increase in pain, or nausea during gait.   Follow Up Recommendations  Follow surgeon's recommendation for DC plan and follow-up therapies     Equipment Recommendations  Rolling walker with 5" wheels;3in1 (PT)    Recommendations for Other Services       Precautions / Restrictions Precautions Precautions: Fall Restrictions Weight Bearing Restrictions: No    Mobility  Bed Mobility Overal bed mobility: Modified Independent Bed Mobility: Supine to Sit     Supine to sit: Supervision     General bed mobility comments: Pt required minimal VC's to use bed rails when sitting up, also needed VC's for propper use of gait belt to help lift leg  Transfers Overall transfer level: Needs assistance Equipment used: Rolling walker (2 wheeled) Transfers: Sit to/from Stand Sit to Stand: Min assist;Min guard         General transfer comment: moderate VC's need to use hands flat on the bed to push up to standing  Ambulation/Gait Ambulation/Gait assistance: Min assist;Min guard Gait Distance (Feet): 50 Feet Assistive device: Rolling walker (2 wheeled) Gait Pattern/deviations: Step-through pattern;Decreased stride length     General Gait Details: Pt  was almost at normal speed while walking, VC's needed for propper technique with RW. No LOB, increase in pain or nausea during gait.   Stairs             Wheelchair Mobility    Modified Rankin (Stroke Patients Only)       Balance                                            Cognition Arousal/Alertness: Awake/alert Behavior During Therapy: WFL for tasks assessed/performed Overall Cognitive Status: Within Functional Limits for tasks assessed                                        Exercises Total Joint Exercises Ankle Circles/Pumps: AROM;Seated;Both;5 reps Quad Sets: AROM;Supine;Right;5 reps Gluteal Sets: AROM;Both;5 reps Heel Slides: AROM;Supine;Right;5 reps    General Comments        Pertinent Vitals/Pain Pain Assessment: 0-10 Pain Score: 1  Pain Location: Rt hip Pain Descriptors / Indicators: Aching Pain Intervention(s): Monitored during session;Repositioned;Ice applied    Home Living                      Prior Function            PT Goals (current goals can now be found in the care plan section)  Frequency           PT Plan Current plan remains appropriate    Co-evaluation              AM-PAC PT "6 Clicks" Mobility   Outcome Measure  Help needed turning from your back to your side while in a flat bed without using bedrails?: A Little Help needed moving from lying on your back to sitting on the side of a flat bed without using bedrails?: A Little Help needed moving to and from a bed to a chair (including a wheelchair)?: A Little Help needed standing up from a chair using your arms (e.g., wheelchair or bedside chair)?: A Little Help needed to walk in hospital room?: A Little Help needed climbing 3-5 steps with a railing? : A Lot 6 Click Score: 17    End of Session Equipment Utilized During Treatment: Gait belt Activity Tolerance: Patient tolerated treatment well Patient left: in  chair;with call bell/phone within reach;with chair alarm set Nurse Communication: Mobility status PT Visit Diagnosis: Muscle weakness (generalized) (M62.81);Difficulty in walking, not elsewhere classified (R26.2)     Time:  - 10:50 - 11:15    Charges:    1 gt     1 te                    Excell Seltzer, [facility]  Reviewed and agree with above Rica Koyanagi  PTA Acute  Rehabilitation Services Pager      [phone] Office      [phone]

## 2019-10-06 NOTE — Progress Notes (Signed)
     Subjective: 1 Day Post-Op Procedure(s) (LRB): TOTAL HIP ARTHROPLASTY ANTERIOR APPROACH (Right)   Patient reports pain as mild, pain controlled.  Patient had an incident of hypertension while walking after surgery yesterday, but no reported events since.  Patient feels that she is doing well and ready to recover.  The procedure, findings and expectations moving forward were discussed the patient.  Patient be ready be discharged home, if she does well with therapy.  Patient follow-up in the clinic in 2 weeks.  Patient knows to call with any questions or concerns.   Objective:   VITALS:   Vitals:   10/06/19 0137 10/06/19 0503  BP: (!) 125/56 (!) 118/59  Pulse: 73 70  Resp: 16 18  Temp: 97.9 F (36.6 C) 97.9 F (36.6 C)  SpO2: 97% 97%    Dorsiflexion/Plantar flexion intact Incision: dressing C/D/I No cellulitis present Compartment soft  LABS Recent Labs    10/06/19 0300  HGB 10.8*  HCT 33.8*  WBC 11.2*  PLT 246    Recent Labs    10/06/19 0300  NA 134*  K 4.2  BUN 13  CREATININE 0.71  GLUCOSE 150*     Assessment/Plan: 1 Day Post-Op Procedure(s) (LRB): TOTAL HIP ARTHROPLASTY ANTERIOR APPROACH (Right) Foley cath DC'd Advance diet Up with therapy D/C IV fluids Discharge home Follow up in 2 weeks at [facility] Follow up with OLIN,Raeden Belzer D in 2 weeks.  Contact information:  [facility] [address], [address] [place] [id number]    Overweight (BMI 25-29.9) Estimated body mass index is 29.69 kg/m as calculated from the following:   Height as of this encounter: 5\' 9"  (1.753 m).   Weight as of this encounter: 91.2 kg. Patient also counseled that weight may inhibit the healing process Patient counseled that losing weight will help with future health issues      West Pugh. Hendry Speas   PAC  10/06/2019, 8:35 AM

## 2019-10-12 NOTE — Discharge Summary (Signed)
Physician Discharge Summary  Patient ID: Lisa Oliver MRN: [id number] DOB/AGE: Nov 01, 1951 68 y.o.  Admit date: 10/05/2019 Discharge date: 10/06/2019   Procedures:  Procedure(s) (LRB): TOTAL HIP ARTHROPLASTY ANTERIOR APPROACH (Right)  Attending Physician:  Dr. Paralee Cancel   Admission Diagnoses:   Right hip primary OA / pain  Discharge Diagnoses:  Active Problems:   Status post right hip replacement   Overweight (BMI 25.0-29.9)  Past Medical History:  Diagnosis Date  . Medical history non-contributory     HPI:    BRIETTA Oliver, 68 y.o. female, has a history of pain and functional disability in the right hip(s) due to arthritis and patient has failed non-surgical conservative treatments for greater than 12 weeks to include NSAID's and/or analgesics and activity modification.  Onset of symptoms was gradual starting 1+ years ago with gradually worsening course since that time.The patient noted no past surgery on the right hip(s).  Patient currently rates pain in the right hip at 10 out of 10 with activity. Patient has night pain, worsening of pain with activity and weight bearing, trendelenberg gait, pain that interfers with activities of daily living and pain with passive range of motion. Patient has evidence of periarticular osteophytes and joint space narrowing by imaging studies. This condition presents safety issues increasing the risk of falls. There is no current active infection.  Risks, benefits and expectations were discussed with the patient.  Risks including but not limited to the risk of anesthesia, blood clots, nerve damage, blood vessel damage, failure of the prosthesis, infection and up to and including death.  Patient understand the risks, benefits and expectations and wishes to proceed with surgery.    PCP: Antony Contras, MD   Discharged Condition: good  Hospital Course:  Patient underwent the above stated procedure on 10/05/2019. Patient tolerated the procedure  well and brought to the recovery room in good condition and subsequently to the floor.  POD #1 BP: 118/59 ; Pulse: 70 ; Temp: 97.9 F (36.6 C) ; Resp: 18 Patient reports pain as mild, pain controlled.  Patient had an incident of hypertension while walking after surgery yesterday, but no reported events since.  Patient feels that she is doing well and ready to recover.  The procedure, findings and expectations moving forward were discussed the patient.  Patient be ready be discharged home. Dorsiflexion/plantar flexion intact, incision: dressing C/D/I, no cellulitis present and compartment soft.   LABS  Basename    HGB     10.8  HCT     33.8    Discharge Exam: General appearance: alert, cooperative and no distress Extremities: Homans sign is negative, no sign of DVT, no edema, redness or tenderness in the calves or thighs and no ulcers, gangrene or trophic changes  Disposition:  Home with follow up in 2 weeks   Follow-up Information    Paralee Cancel, MD. Schedule an appointment as soon as possible for a visit in 2 weeks.   Specialty: Orthopedic Surgery Contact information: [address] [place] [address] [id number]           Discharge Instructions    Call MD / Call 911   Complete by: As directed    If you experience chest pain or shortness of breath, CALL 911 and be transported to the hospital emergency room.  If you develope a fever above 101 F, pus (white drainage) or increased drainage or redness at the wound, or calf pain, call your surgeon's office.   Change  dressing   Complete by: As directed    Maintain surgical dressing until follow up in the clinic. If the edges start to pull up, may reinforce with tape. If the dressing is no longer working, may remove and cover with gauze and tape, but must keep the area dry and clean.  Call with any questions or concerns.   Constipation Prevention   Complete by: As directed    Drink plenty of fluids.  Prune  juice may be helpful.  You may use a stool softener, such as Colace (over the counter) 100 mg twice a day.  Use MiraLax (over the counter) for constipation as needed.   Diet - low sodium heart healthy   Complete by: As directed    Discharge instructions   Complete by: As directed    Maintain surgical dressing until follow up in the clinic. If the edges start to pull up, may reinforce with tape. If the dressing is no longer working, may remove and cover with gauze and tape, but must keep the area dry and clean.  Follow up in 2 weeks at [facility]. Call with any questions or concerns.   Increase activity slowly as tolerated   Complete by: As directed    Weight bearing as tolerated with assist device (walker, cane, etc) as directed, use it as long as suggested by your surgeon or therapist, typically at least 4-6 weeks.   TED hose   Complete by: As directed    Use stockings (TED hose) for 2 weeks on both leg(s).  You may remove them at night for sleeping.      Allergies as of 10/06/2019      Reactions   Other    depomedrol-hives      Medication List    STOP taking these medications   ibuprofen 200 MG tablet Commonly known as: ADVIL     TAKE these medications   aspirin 81 MG chewable tablet Commonly known as: Aspirin Childrens Chew 1 tablet (81 mg total) by mouth 2 (two) times daily. Take for 4 weeks, then resume regular dose.   b complex vitamins capsule Take 1 capsule by mouth daily as needed (IMMUNE SUPPORT/ENERGY).   DIGESTIVE ENZYME PO Take 1 capsule by mouth daily with breakfast. Enzyme Forte   docusate sodium 100 MG capsule Commonly known as: Colace Take 1 capsule (100 mg total) by mouth 2 (two) times daily.   ferrous sulfate 325 (65 FE) MG tablet Commonly known as: FerrouSul Take 1 tablet (325 mg total) by mouth 3 (three) times daily with meals for 14 days.   HYDROcodone-acetaminophen 5-325 MG tablet Commonly known as: Norco Take 1-2 tablets by mouth  every 4 (four) hours as needed for moderate pain or severe pain.   KELP PO Take 225 mcg by mouth daily.   methocarbamol 500 MG tablet Commonly known as: Robaxin Take 1 tablet (500 mg total) by mouth every 6 (six) hours as needed for muscle spasms.   metroNIDAZOLE 1 % gel Commonly known as: METROGEL Apply 1 application topically daily. rosacea   MULTIPLE MINERALS PO Take 1 tablet by mouth daily.   multivitamin with minerals Tabs tablet Take 1 tablet by mouth daily with breakfast.   Natural Balance Tears 0.1-0.3 % Soln Generic drug: Dextran 70-Hypromellose Place 1 drop into both eyes at bedtime as needed (DRY/IRRITATED EYES.).   OMEGA-3 FISH OIL PO Take 2 capsules by mouth daily with breakfast.   polyethylene glycol 17 g packet Commonly known as: MIRALAX /  GLYCOLAX Take 17 g by mouth 2 (two) times daily.   Synthroid 112 MCG tablet Generic drug: levothyroxine Take 112 mcg by mouth every evening. Brand Name ONLY   VITAMIN A PO Take 1 capsule by mouth daily as needed (IMMUNE SUPPORT). water soluble   vitamin C 500 MG tablet Commonly known as: ASCORBIC ACID Take 500 mg by mouth daily as needed (IMMUNE SUPPORT).   Vitamin D-3 125 MCG (5000 UT) Tabs Take 5,000 Units by mouth daily with breakfast.   vitamin E 400 UNIT capsule Take 400 Units by mouth 4 (four) times a week.            Discharge Care Instructions  (From admission, onward)         Start     Ordered   10/06/19 0000  Change dressing    Comments: Maintain surgical dressing until follow up in the clinic. If the edges start to pull up, may reinforce with tape. If the dressing is no longer working, may remove and cover with gauze and tape, but must keep the area dry and clean.  Call with any questions or concerns.   10/06/19 [id number]           Signed: West Pugh. Onda Kattner   PA-C  10/12/2019, 9:33 AM

## 2019-11-10 IMAGING — MG DIGITAL SCREENING BILATERAL MAMMOGRAM WITH TOMO AND CAD
8 series · 9 of 24 positions shown · non-contrast
Comparison: Previous exam(s).

CLINICAL DATA: Screening.

EXAM:
DIGITAL SCREENING BILATERAL MAMMOGRAM WITH TOMO AND CAD

[R MLO synth-2D]
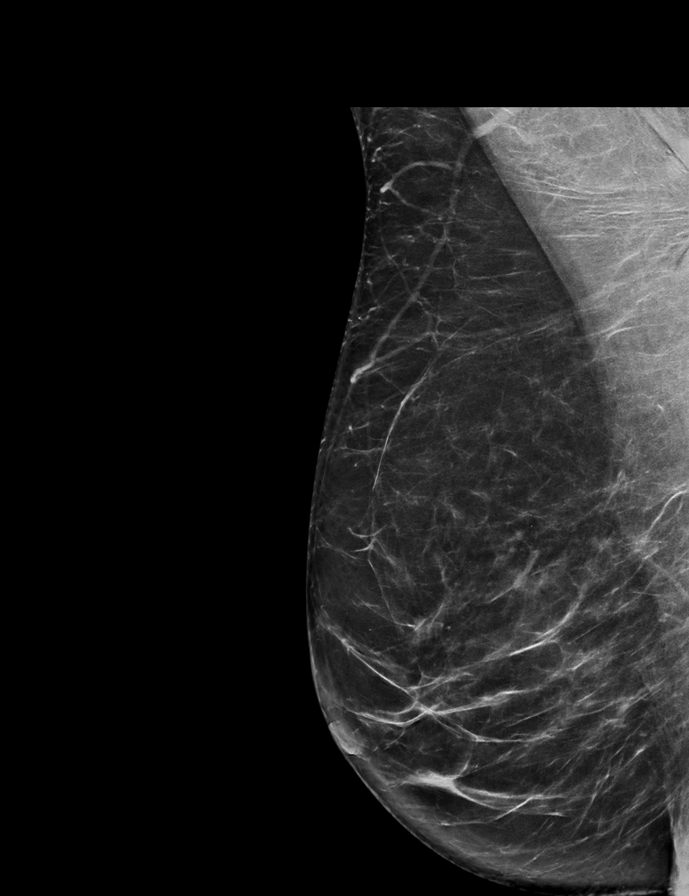

[L MLO synth-2D]
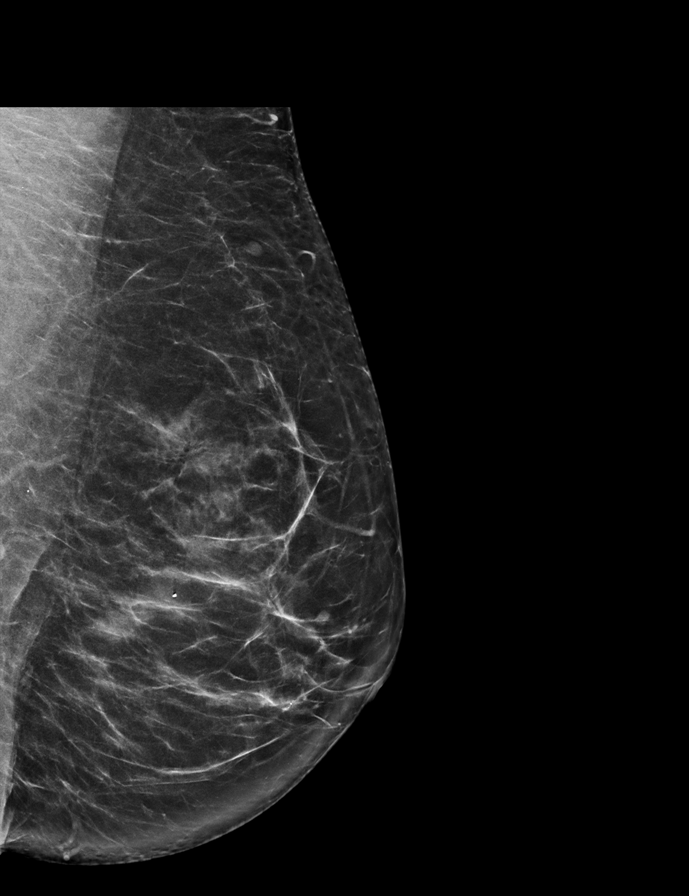

[R CC synth-2D]
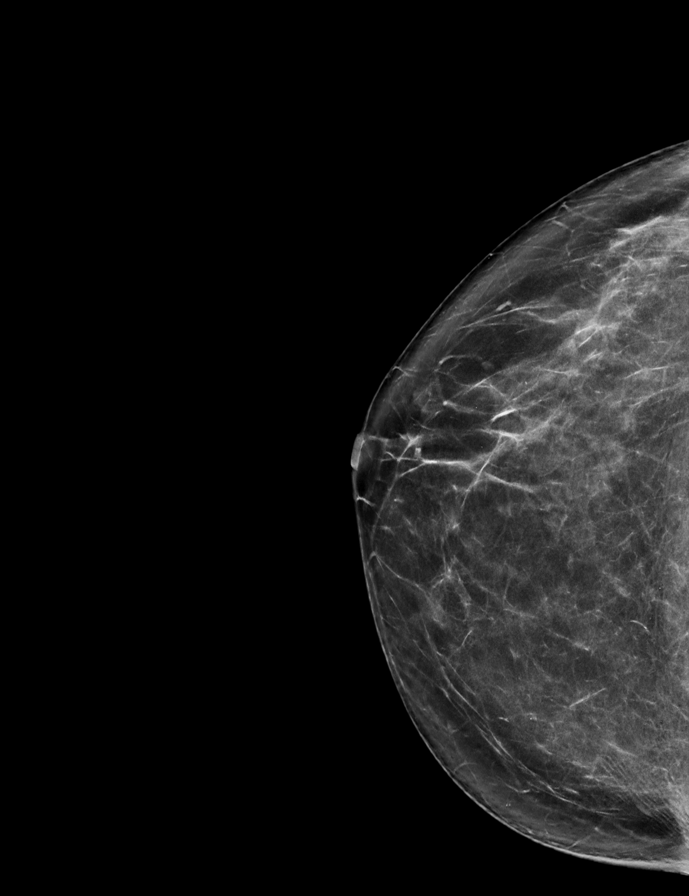

[L CC synth-2D]
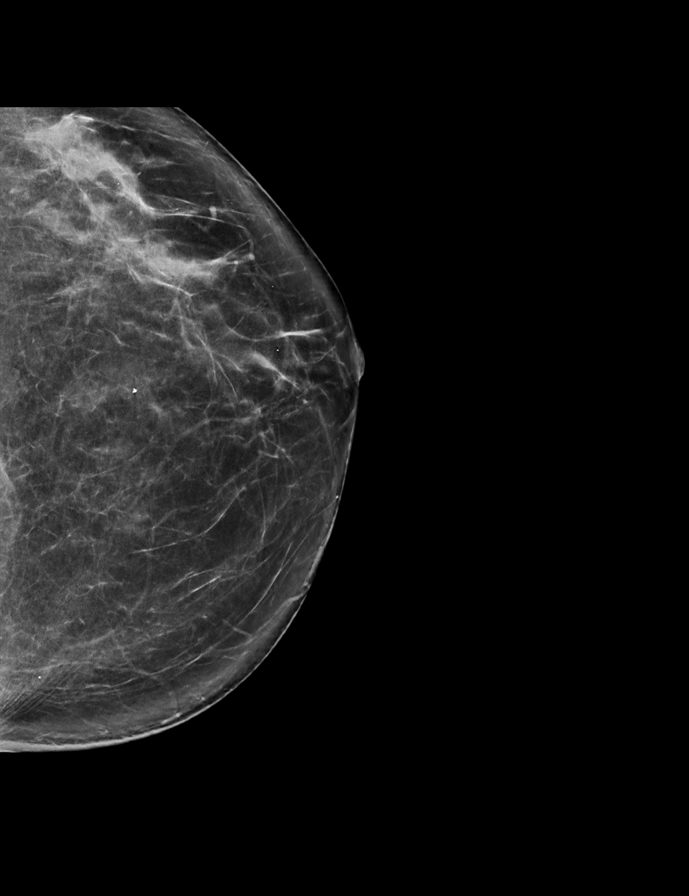

[R MLO tomo · 2 of 74 frames shown]
[frame 24/74]
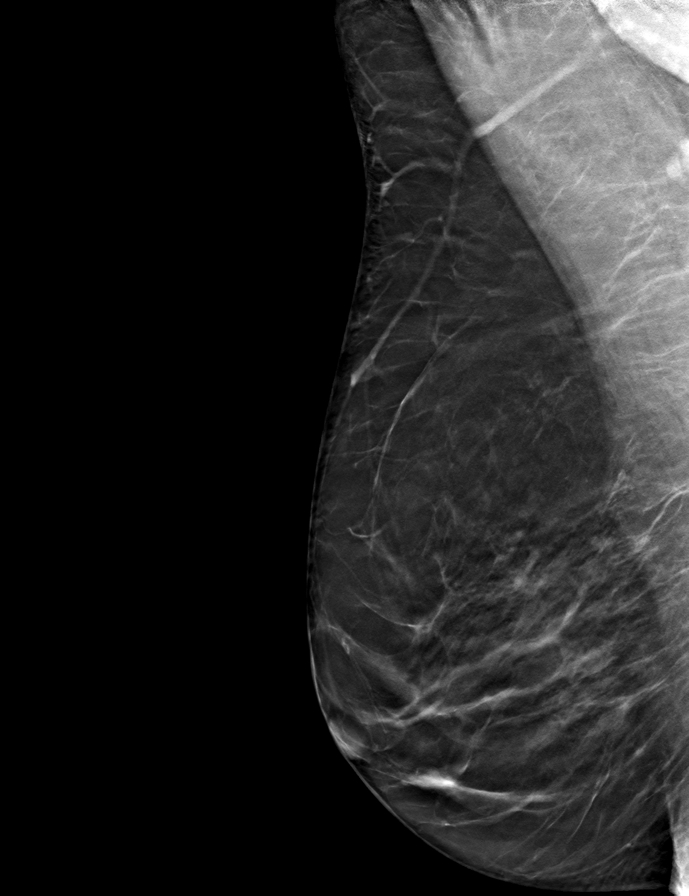
[frame 37/74]
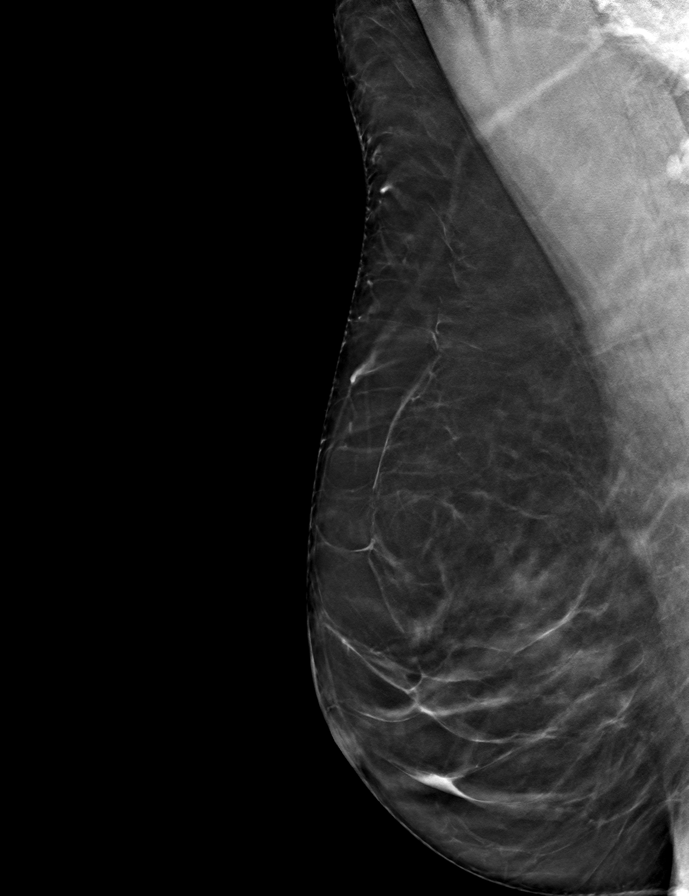

[L MLO tomo · tomo slice 37/74.0]
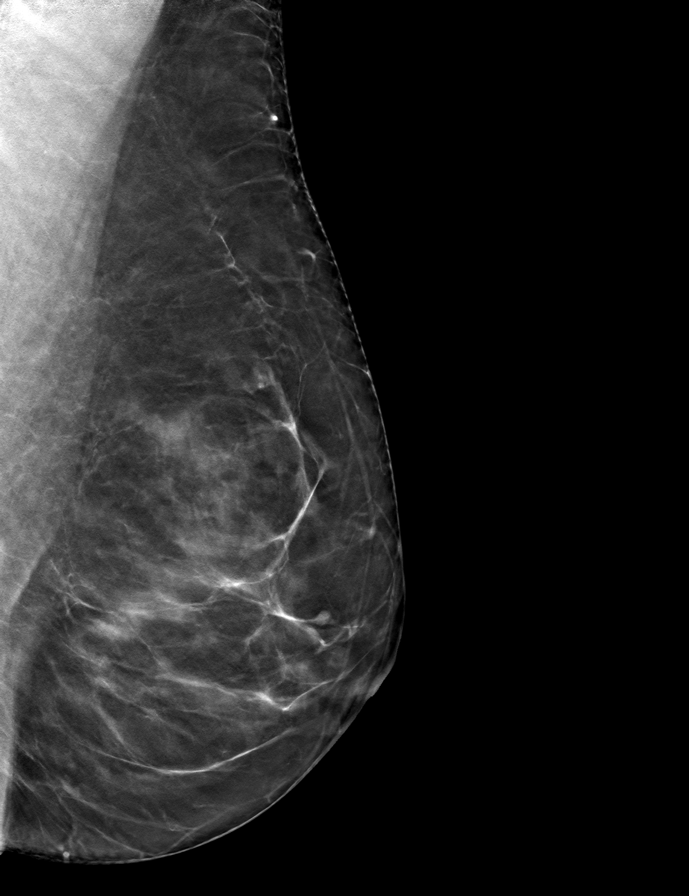

[R CC tomo · tomo slice 37/73.0]
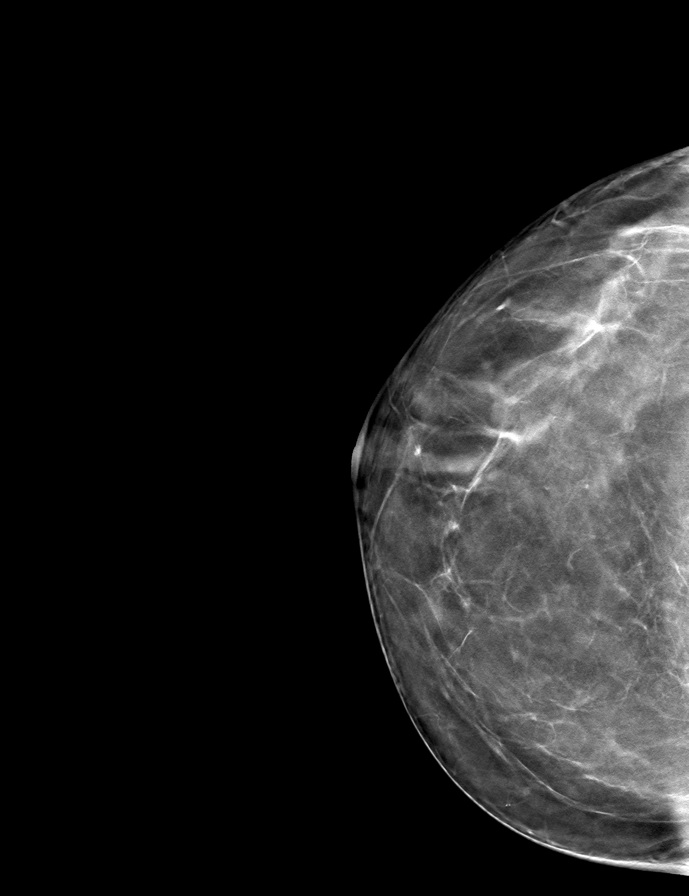

[L CC tomo · tomo slice 38/75.0]
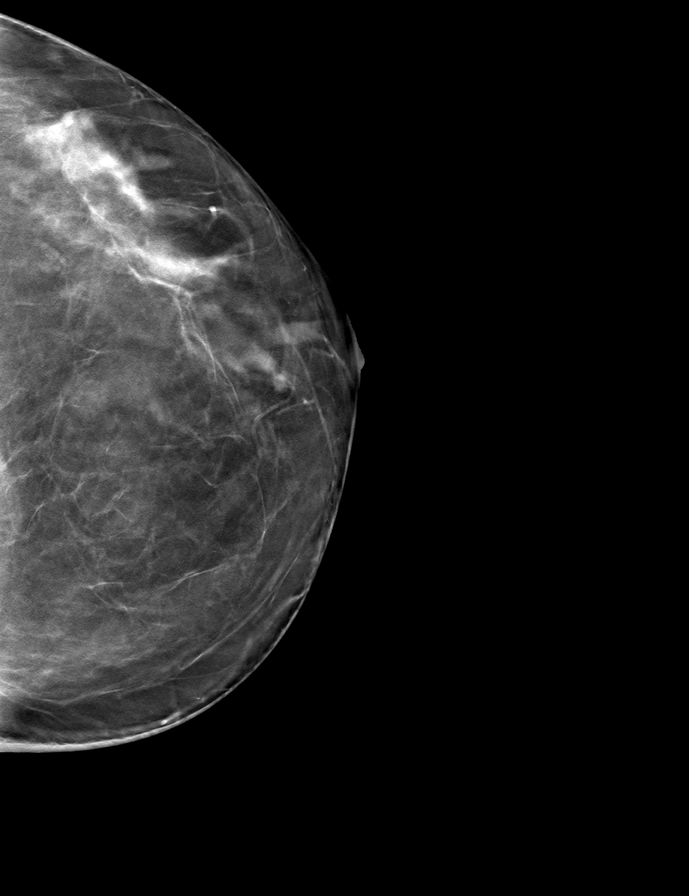

[9 of 24 positions shown; findings below may reference images not displayed]

ACR Breast Density Category b: There are scattered areas of
fibroglandular density.
FINDINGS: In the left breast, possible architectural distortion, with a
possible adjacent mass, warrants further evaluation. In the right
breast, no findings suspicious for malignancy. Images were processed
with CAD.
IMPRESSION: Further evaluation is suggested for possible distortion and adjacent
mass in the left breast.

RECOMMENDATION:
Diagnostic mammogram and possibly ultrasound of the left breast.
(Code:[id number])

The patient will be contacted regarding the findings, and additional
imaging will be scheduled.

BI-RADS CATEGORY  0: Incomplete. Need additional imaging evaluation
and/or prior mammograms for comparison.

## 2020-01-27 ENCOUNTER — Other Ambulatory Visit: Payer: Self-pay

## 2020-01-27 ENCOUNTER — Ambulatory Visit: Payer: Medicare Other | Attending: Internal Medicine

## 2020-01-27 DIAGNOSIS — Z23 Encounter for immunization: Secondary | ICD-10-CM | POA: Insufficient documentation

## 2020-01-27 NOTE — Progress Notes (Signed)
   Covid-19 Vaccination Clinic  Name:  Lisa Oliver    MRN: [id number] DOB: [birth year]  01/27/2020  Lisa Oliver was observed post Covid-19 immunization for 15 minutes without incidence. She was provided with Vaccine Information Sheet and instruction to access the V-Safe system.   Lisa Oliver was instructed to call 911 with any severe reactions post vaccine: Marland Kitchen Difficulty breathing  . Swelling of your face and throat  . A fast heartbeat  . A bad rash all over your body  . Dizziness and weakness    Immunizations Administered    Name Date Dose VIS Date Route   Pfizer COVID-19 Vaccine 01/27/2020  9:13 AM 0.3 mL 11/26/2019 Intramuscular   Manufacturer: [place]   Lot: [id number]   NDC: [id number]

## 2020-02-10 ENCOUNTER — Ambulatory Visit: Payer: Self-pay

## 2020-02-23 ENCOUNTER — Ambulatory Visit: Payer: Medicare Other | Attending: Internal Medicine

## 2020-02-23 DIAGNOSIS — Z23 Encounter for immunization: Secondary | ICD-10-CM

## 2020-02-23 NOTE — Progress Notes (Signed)
   Covid-19 Vaccination Clinic  Name:  Lisa Oliver    MRN: [id number] DOB: 1951-03-09  02/23/2020  Lisa Oliver was observed post Covid-19 immunization for 15 minutes without incident. She was provided with Vaccine Information Sheet and instruction to access the V-Safe system.   Lisa Oliver was instructed to call 911 with any severe reactions post vaccine: Marland Kitchen Difficulty breathing  . Swelling of face and throat  . A fast heartbeat  . A bad rash all over body  . Dizziness and weakness   Immunizations Administered    Name Date Dose VIS Date Route   Pfizer COVID-19 Vaccine 02/23/2020  9:31 AM 0.3 mL 11/26/2019 Intramuscular   Manufacturer: [place]   Lot: [id number]   [place]: [id number]

## 2020-07-19 ENCOUNTER — Other Ambulatory Visit: Payer: Self-pay | Admitting: Family Medicine

## 2020-07-19 DIAGNOSIS — Z1231 Encounter for screening mammogram for malignant neoplasm of breast: Secondary | ICD-10-CM

## 2020-08-09 ENCOUNTER — Ambulatory Visit: Payer: Medicare Other

## 2020-10-18 ENCOUNTER — Ambulatory Visit: Payer: Medicare Other

## 2020-11-23 ENCOUNTER — Other Ambulatory Visit: Payer: Self-pay

## 2020-11-23 ENCOUNTER — Ambulatory Visit
Admission: RE | Admit: 2020-11-23 | Discharge: 2020-11-23 | Disposition: A | Discharge status: Home / Self Care | Payer: Medicare Other | Source: Ambulatory Visit | Attending: Family Medicine | Admitting: Family Medicine

## 2020-11-23 DIAGNOSIS — Z1231 Encounter for screening mammogram for malignant neoplasm of breast: Secondary | ICD-10-CM

## 2021-03-30 ENCOUNTER — Other Ambulatory Visit: Payer: Self-pay | Admitting: Nurse Practitioner

## 2021-03-30 DIAGNOSIS — R5381 Other malaise: Secondary | ICD-10-CM

## 2021-04-23 ENCOUNTER — Other Ambulatory Visit: Payer: Self-pay

## 2021-04-23 ENCOUNTER — Encounter: Payer: Self-pay | Admitting: Cardiology

## 2021-04-23 ENCOUNTER — Ambulatory Visit: Payer: Medicare Other | Admitting: Cardiology

## 2021-04-23 VITALS — HR 90 | Ht 69.0 in | Wt 209.0 lb

## 2021-04-23 DIAGNOSIS — R002 Palpitations: Secondary | ICD-10-CM | POA: Diagnosis not present

## 2021-04-23 DIAGNOSIS — Z8249 Family history of ischemic heart disease and other diseases of the circulatory system: Secondary | ICD-10-CM | POA: Diagnosis not present

## 2021-04-23 NOTE — Patient Instructions (Signed)
Medication Instructions:  Your physician recommends that you continue on your current medications as directed. Please refer to the Current Medication list given to you today.  *If you need a refill on your cardiac medications before your next appointment, please call your pharmacy*  Testing/Procedures: Your physician has requested that you have an exercise tolerance test. For further information please visit [email or web address]. Please also follow instruction sheet, as given.  Your physician has requested that you have a calcium score CT scan.   Follow-Up: At [facility], you and your health needs are our priority.  As part of our continuing mission to provide you with exceptional heart care, we have created designated Provider Care Teams.  These Care Teams include your primary Cardiologist (physician) and Advanced Practice Providers (APPs -  Physician Assistants and Nurse Practitioners) who all work together to provide you with the care you need, when you need it.  Follow up with Dr. Turner as needed based on results of testing.   

## 2021-04-23 NOTE — Addendum Note (Signed)
Addended by: Dakoda Bassette R on: 04/23/2021 02:51 PM   Modules accepted: Orders  

## 2021-04-23 NOTE — Addendum Note (Signed)
Addended by: Antonieta Iba on: 04/23/2021 01:01 PM   Modules accepted: Orders

## 2021-04-23 NOTE — Progress Notes (Signed)
Cardiology Office Note    Date:  04/23/2021   ID:  KENDAHL BUMGARDNER, DOB 1951-10-04, MRN [id number]  PCP:  Antony Contras, MD  Cardiologist:  Fransico Him, MD   Chief Complaint  Patient presents with  . New Patient (Initial Visit)    Family hx of CAD, palpitations    History of Present Illness:  Lisa Oliver is a 70 y.o. female who is being seen today for the evaluation of family hx of CAD at the request of Antony Contras, MD.  Her mother had CAD in her 39's with an MI and then had PCI.  She is now deceased.  She denies any chest pain or pressure, SOB, DOE, PND, orthopnea, LE edema, dizziness or syncope. She has been having palpitations sporadically over the past year. She does occasionally drink a Dr. Malachi Bonds She is compliant with her meds and is tolerating meds with no SE.    Past Medical History:  Diagnosis Date  . Medical history non-contributory     Past Surgical History:  Procedure Laterality Date  . BREAST BIOPSY Left 08/2018   PASH  . BREAST LUMPECTOMY WITH RADIOACTIVE SEED LOCALIZATION Left 09/10/2018   Procedure: BREAST LUMPECTOMY WITH RADIOACTIVE SEED LOCALIZATION;  Surgeon: Erroll Luna, MD;  Location: [facility];  Service: General;  Laterality: Left;  . BREAST SURGERY    . DIAGNOSTIC LAPAROSCOPY     cyst on ovary  . DILATION AND CURETTAGE OF UTERUS    . TOTAL HIP ARTHROPLASTY Right 10/05/2019   Procedure: TOTAL HIP ARTHROPLASTY ANTERIOR APPROACH;  Surgeon: Paralee Cancel, MD;  Location: [facility];  Service: Orthopedics;  Laterality: Right;  70 mins Requests am surgery time    Current Medications: Current Meds  Medication Sig  . b complex vitamins capsule Take 1 capsule by mouth daily as needed (IMMUNE SUPPORT/ENERGY).  . Cholecalciferol (VITAMIN D-3) 125 MCG (5000 UT) TABS Take 5,000 Units by mouth daily with breakfast.  . Digestive Enzymes (DIGESTIVE ENZYME PO) Take 1 capsule by mouth daily with breakfast. Enzyme Forte  . Iodine, Kelp, (KELP PO)  Take 225 mcg by mouth daily.  . metroNIDAZOLE (METROGEL) 1 % gel Apply 1 application topically daily. rosacea  . MULTIPLE MINERALS PO Take 1 tablet by mouth daily.   . Multiple Vitamin (MULTIVITAMIN WITH MINERALS) TABS tablet Take 1 tablet by mouth daily with breakfast.  . NATURAL BALANCE TEARS 0.1-0.3 % SOLN Place 1 drop into both eyes at bedtime as needed (DRY/IRRITATED EYES.).  Marland Kitchen Omega-3 Fatty Acids (OMEGA-3 FISH OIL PO) Take 2 capsules by mouth daily with breakfast.  . polyethylene glycol (MIRALAX / GLYCOLAX) 17 g packet Take 17 g by mouth 2 (two) times daily.  Marland Kitchen SYNTHROID 112 MCG tablet Take 112 mcg by mouth every evening. Brand Name ONLY  . VITAMIN A PO Take 1 capsule by mouth daily as needed (IMMUNE SUPPORT). water soluble  . vitamin C (ASCORBIC ACID) 500 MG tablet Take 500 mg by mouth daily as needed (IMMUNE SUPPORT).  . vitamin E 400 UNIT capsule Take 400 Units by mouth 4 (four) times a week.  . [DISCONTINUED] docusate sodium (COLACE) 100 MG capsule Take 1 capsule (100 mg total) by mouth 2 (two) times daily.  . [DISCONTINUED] ferrous sulfate (FERROUSUL) 325 (65 FE) MG tablet Take 1 tablet (325 mg total) by mouth 3 (three) times daily with meals for 14 days.    Allergies:   Other   Social History   Socioeconomic History  . Marital status:  Married    Spouse name: Not on file  . Number of children: Not on file  . Years of education: Not on file  . Highest education level: Not on file  Occupational History  . Not on file  Tobacco Use  . Smoking status: Never Smoker  . Smokeless tobacco: Never Used  Vaping Use  . Vaping Use: Never used  Substance and Sexual Activity  . Alcohol use: Yes    Alcohol/week: 1.0 standard drink    Types: 1 Shots of liquor per week    Comment: weekly  . Drug use: Never  . Sexual activity: Not on file  Other Topics Concern  . Not on file  Social History Narrative  . Not on file   Social Determinants of Health   Financial Resource Strain: Not  on file  Food Insecurity: Not on file  Transportation Needs: Not on file  Physical Activity: Not on file  Stress: Not on file  Social Connections: Not on file     Family History:  The patient's family history includes CAD in her mother; Hip fracture in her father; Pneumonia in her father.   ROS:   Please see the history of present illness.    ROS All other systems reviewed and are negative.  No flowsheet data found.     PHYSICAL EXAM:   VS:  Pulse 90   Ht 5\' 9"  (1.753 m)   Wt 209 lb (94.8 kg)   SpO2 96%   BMI 30.86 kg/m    GEN: Well nourished, well developed, in no acute distress  HEENT: normal  Neck: no JVD, carotid bruits, or masses Cardiac: RRR; no murmurs, rubs, or gallops,no edema.  Intact distal pulses bilaterally.  Respiratory:  clear to auscultation bilaterally, normal work of breathing GI: soft, nontender, nondistended, + BS MS: no deformity or atrophy  Skin: warm and dry, no rash Neuro:  Alert and Oriented x 3, Strength and sensation are intact Psych: euthymic mood, full affect  Wt Readings from Last 3 Encounters:  04/23/21 209 lb (94.8 kg)  10/05/19 201 lb 1 oz (91.2 kg)  09/27/19 201 lb 1 oz (91.2 kg)      Studies/Labs Reviewed:   EKG:  EKG is ordered today.  The ekg ordered today demonstrates NSR with no ST changes  Recent Labs: No results found for requested labs within last 8760 hours.   Lipid Panel No results found for: CHOL, TRIG, HDL, CHOLHDL, VLDL, LDLCALC, LDLDIRECT   Additional studies/ records that were reviewed today include:  none    ASSESSMENT:    1. Family history of coronary artery disease in mother   2. Palpitations      PLAN:  In order of problems listed above:  1. Family history of CAD -her mom had CAD in her 41's -she has no CRFs except for fm hx  -she has never smoked -I will get a coronary Ca score and ETT to assess cardiac risk -Shared Decision Making/Informed Consent The risks [chest pain, shortness of  breath, cardiac arrhythmias, dizziness, blood pressure fluctuations, myocardial infarction, stroke/transient ischemic attack, and life-threatening complications (estimated to be 1 in 10,000)], benefits (risk stratification, diagnosing coronary artery disease, treatment guidance) and alternatives of an exercise tolerance test were discussed in detail with Ms. Lindo and she agrees to proceed.  2.  Palpitations -she sporadically gets palpitations that she has noticed over the past year -I will get an event monitor to assess -encouraged her to cut back on caffeine  Medication Adjustments/Labs and Tests Ordered: Current medicines are reviewed at length with the patient today.  Concerns regarding medicines are outlined above.  Medication changes, Labs and Tests ordered today are listed in the Patient Instructions below.  There are no Patient Instructions on file for this visit.   Signed, Fransico Him, MD  04/23/2021 12:51 PM    [facility] [address], [place], [place]  [address] Phone: [phone]; Fax: [phone]

## 2021-05-09 ENCOUNTER — Other Ambulatory Visit: Payer: Self-pay | Admitting: Nurse Practitioner

## 2021-05-09 DIAGNOSIS — E2839 Other primary ovarian failure: Secondary | ICD-10-CM

## 2021-05-24 ENCOUNTER — Ambulatory Visit (INDEPENDENT_AMBULATORY_CARE_PROVIDER_SITE_OTHER): Payer: Medicare Other

## 2021-05-24 ENCOUNTER — Other Ambulatory Visit: Payer: Self-pay

## 2021-05-24 ENCOUNTER — Ambulatory Visit (INDEPENDENT_AMBULATORY_CARE_PROVIDER_SITE_OTHER)
Admission: RE | Admit: 2021-05-24 | Discharge: 2021-05-24 | Disposition: A | Discharge status: Home / Self Care | Payer: Self-pay | Source: Ambulatory Visit | Attending: Cardiology | Admitting: Cardiology

## 2021-05-24 DIAGNOSIS — Z8249 Family history of ischemic heart disease and other diseases of the circulatory system: Secondary | ICD-10-CM

## 2021-05-24 LAB — EXERCISE TOLERANCE TEST
Estimated workload: 3.8 METS
Exercise duration (min): 1 min
Exercise duration (sec): 29 s
MPHR: 149 {beats}/min
Peak HR: 137 {beats}/min
Percent HR: 91 %
RPE: 15
Rest HR: 84 {beats}/min

## 2021-07-12 DIAGNOSIS — H5203 Hypermetropia, bilateral: Secondary | ICD-10-CM | POA: Diagnosis not present

## 2021-07-12 DIAGNOSIS — H2513 Age-related nuclear cataract, bilateral: Secondary | ICD-10-CM | POA: Diagnosis not present

## 2021-07-12 DIAGNOSIS — H43813 Vitreous degeneration, bilateral: Secondary | ICD-10-CM | POA: Diagnosis not present

## 2021-07-26 DIAGNOSIS — D649 Anemia, unspecified: Secondary | ICD-10-CM | POA: Diagnosis not present

## 2021-07-26 DIAGNOSIS — E559 Vitamin D deficiency, unspecified: Secondary | ICD-10-CM | POA: Diagnosis not present

## 2021-07-26 DIAGNOSIS — R7303 Prediabetes: Secondary | ICD-10-CM | POA: Diagnosis not present

## 2021-07-26 DIAGNOSIS — Z136 Encounter for screening for cardiovascular disorders: Secondary | ICD-10-CM | POA: Diagnosis not present

## 2021-07-26 DIAGNOSIS — E039 Hypothyroidism, unspecified: Secondary | ICD-10-CM | POA: Diagnosis not present

## 2021-07-26 DIAGNOSIS — Z13220 Encounter for screening for lipoid disorders: Secondary | ICD-10-CM | POA: Diagnosis not present

## 2021-08-02 DIAGNOSIS — Z0000 Encounter for general adult medical examination without abnormal findings: Secondary | ICD-10-CM | POA: Diagnosis not present

## 2021-08-02 DIAGNOSIS — Z1211 Encounter for screening for malignant neoplasm of colon: Secondary | ICD-10-CM | POA: Diagnosis not present

## 2021-08-02 DIAGNOSIS — Z862 Personal history of diseases of the blood and blood-forming organs and certain disorders involving the immune mechanism: Secondary | ICD-10-CM | POA: Diagnosis not present

## 2021-08-02 DIAGNOSIS — R232 Flushing: Secondary | ICD-10-CM | POA: Diagnosis not present

## 2021-08-02 DIAGNOSIS — R7303 Prediabetes: Secondary | ICD-10-CM | POA: Diagnosis not present

## 2021-08-02 DIAGNOSIS — Z1389 Encounter for screening for other disorder: Secondary | ICD-10-CM | POA: Diagnosis not present

## 2021-08-02 DIAGNOSIS — E039 Hypothyroidism, unspecified: Secondary | ICD-10-CM | POA: Diagnosis not present

## 2021-08-02 DIAGNOSIS — E559 Vitamin D deficiency, unspecified: Secondary | ICD-10-CM | POA: Diagnosis not present

## 2021-08-03 DIAGNOSIS — Z1211 Encounter for screening for malignant neoplasm of colon: Secondary | ICD-10-CM | POA: Diagnosis not present

## 2021-09-19 DIAGNOSIS — Z96641 Presence of right artificial hip joint: Secondary | ICD-10-CM | POA: Diagnosis not present

## 2021-10-16 DIAGNOSIS — M79671 Pain in right foot: Secondary | ICD-10-CM | POA: Diagnosis not present

## 2021-10-25 ENCOUNTER — Other Ambulatory Visit: Payer: Self-pay | Admitting: Family Medicine

## 2021-10-25 DIAGNOSIS — Z1231 Encounter for screening mammogram for malignant neoplasm of breast: Secondary | ICD-10-CM

## 2021-10-29 ENCOUNTER — Other Ambulatory Visit: Payer: Self-pay | Admitting: Nurse Practitioner

## 2021-10-29 DIAGNOSIS — E2839 Other primary ovarian failure: Secondary | ICD-10-CM

## 2021-10-31 DIAGNOSIS — M19071 Primary osteoarthritis, right ankle and foot: Secondary | ICD-10-CM | POA: Diagnosis not present

## 2021-11-01 ENCOUNTER — Other Ambulatory Visit: Payer: Medicare Other

## 2021-11-30 DIAGNOSIS — L300 Nummular dermatitis: Secondary | ICD-10-CM | POA: Diagnosis not present

## 2021-11-30 DIAGNOSIS — B372 Candidiasis of skin and nail: Secondary | ICD-10-CM | POA: Diagnosis not present

## 2021-11-30 DIAGNOSIS — D1801 Hemangioma of skin and subcutaneous tissue: Secondary | ICD-10-CM | POA: Diagnosis not present

## 2021-11-30 DIAGNOSIS — L570 Actinic keratosis: Secondary | ICD-10-CM | POA: Diagnosis not present

## 2021-11-30 DIAGNOSIS — B351 Tinea unguium: Secondary | ICD-10-CM | POA: Diagnosis not present

## 2021-11-30 DIAGNOSIS — L821 Other seborrheic keratosis: Secondary | ICD-10-CM | POA: Diagnosis not present

## 2021-12-19 ENCOUNTER — Other Ambulatory Visit: Payer: Self-pay

## 2021-12-19 ENCOUNTER — Ambulatory Visit: Payer: Medicare Other | Admitting: Podiatry

## 2021-12-19 ENCOUNTER — Encounter: Payer: Self-pay | Admitting: Podiatry

## 2021-12-19 DIAGNOSIS — B351 Tinea unguium: Secondary | ICD-10-CM | POA: Diagnosis not present

## 2021-12-19 DIAGNOSIS — M76811 Anterior tibial syndrome, right leg: Secondary | ICD-10-CM | POA: Diagnosis not present

## 2021-12-19 DIAGNOSIS — L309 Dermatitis, unspecified: Secondary | ICD-10-CM

## 2021-12-19 NOTE — Progress Notes (Signed)
Subjective:   Patient ID: Lisa Oliver, female   DOB: 71 y.o.   MRN: [id number]   HPI Patient presents with several concerns with 1 being cracks in the heel another being pain on the inside of the right foot with previous diagnosis of arthritis and third being nail disease.  Patient does not smoke likes to be active   Review of Systems  All other systems reviewed and are negative.      Objective:  Physical Exam Vitals and nursing note reviewed.  Constitutional:      Appearance: She is well-developed.  Pulmonary:     Effort: Pulmonary effort is normal.  Musculoskeletal:        General: Normal range of motion.  Skin:    General: Skin is warm.  Neurological:     Mental Status: She is alert.    Neurovascular status was found to be intact muscle strength found to be adequate range of motion adequate.  There is some stress on the navicular cuneiform joint right with a long narrow foot and patient having over-the-counter insoles which are giving reduced discomfort but still some problems along with friction of the left heel medial side which may be due to improper shoe gear not being worn and also nail disease with discoloration.  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  I have recommended the consideration for customized orthotics discussed this with her and the benefits that they would provide and at this point I went ahead and will try to continue with the over-the-counter.  I then debrided tissue left we will start using cream on this and also Vaseline under occlusion and I do not recommend current treatment for nails as I think trauma is the biggest problem

## 2022-01-09 ENCOUNTER — Other Ambulatory Visit: Payer: Self-pay | Admitting: Family Medicine

## 2022-01-09 DIAGNOSIS — M19071 Primary osteoarthritis, right ankle and foot: Secondary | ICD-10-CM | POA: Diagnosis not present

## 2022-01-09 DIAGNOSIS — Z1231 Encounter for screening mammogram for malignant neoplasm of breast: Secondary | ICD-10-CM

## 2022-01-23 ENCOUNTER — Other Ambulatory Visit: Payer: Self-pay

## 2022-01-23 ENCOUNTER — Ambulatory Visit
Admission: RE | Admit: 2022-01-23 | Discharge: 2022-01-23 | Disposition: A | Discharge status: Home / Self Care | Payer: Medicare Other | Source: Ambulatory Visit

## 2022-01-23 DIAGNOSIS — Z1231 Encounter for screening mammogram for malignant neoplasm of breast: Secondary | ICD-10-CM

## 2022-04-03 ENCOUNTER — Ambulatory Visit
Admission: RE | Admit: 2022-04-03 | Discharge: 2022-04-03 | Disposition: A | Discharge status: Home / Self Care | Payer: Medicare Other | Source: Ambulatory Visit | Attending: Nurse Practitioner | Admitting: Nurse Practitioner

## 2022-04-03 DIAGNOSIS — Z780 Asymptomatic menopausal state: Secondary | ICD-10-CM | POA: Diagnosis not present

## 2022-04-03 DIAGNOSIS — M85852 Other specified disorders of bone density and structure, left thigh: Secondary | ICD-10-CM | POA: Diagnosis not present

## 2022-04-03 DIAGNOSIS — E2839 Other primary ovarian failure: Secondary | ICD-10-CM

## 2022-05-02 DIAGNOSIS — H6981 Other specified disorders of Eustachian tube, right ear: Secondary | ICD-10-CM | POA: Diagnosis not present

## 2022-07-23 DIAGNOSIS — H52203 Unspecified astigmatism, bilateral: Secondary | ICD-10-CM | POA: Diagnosis not present

## 2022-07-23 DIAGNOSIS — H5203 Hypermetropia, bilateral: Secondary | ICD-10-CM | POA: Diagnosis not present

## 2022-07-23 DIAGNOSIS — H43813 Vitreous degeneration, bilateral: Secondary | ICD-10-CM | POA: Diagnosis not present

## 2022-07-23 DIAGNOSIS — H2513 Age-related nuclear cataract, bilateral: Secondary | ICD-10-CM | POA: Diagnosis not present

## 2022-08-15 DIAGNOSIS — E039 Hypothyroidism, unspecified: Secondary | ICD-10-CM | POA: Diagnosis not present

## 2022-08-15 DIAGNOSIS — R7303 Prediabetes: Secondary | ICD-10-CM | POA: Diagnosis not present

## 2022-08-15 DIAGNOSIS — Z862 Personal history of diseases of the blood and blood-forming organs and certain disorders involving the immune mechanism: Secondary | ICD-10-CM | POA: Diagnosis not present

## 2022-08-15 DIAGNOSIS — E559 Vitamin D deficiency, unspecified: Secondary | ICD-10-CM | POA: Diagnosis not present

## 2022-08-22 DIAGNOSIS — Z0000 Encounter for general adult medical examination without abnormal findings: Secondary | ICD-10-CM | POA: Diagnosis not present

## 2022-08-22 DIAGNOSIS — E039 Hypothyroidism, unspecified: Secondary | ICD-10-CM | POA: Diagnosis not present

## 2022-08-22 DIAGNOSIS — R7303 Prediabetes: Secondary | ICD-10-CM | POA: Diagnosis not present

## 2022-08-22 DIAGNOSIS — E559 Vitamin D deficiency, unspecified: Secondary | ICD-10-CM | POA: Diagnosis not present

## 2022-08-22 DIAGNOSIS — R232 Flushing: Secondary | ICD-10-CM | POA: Diagnosis not present

## 2022-08-22 DIAGNOSIS — R030 Elevated blood-pressure reading, without diagnosis of hypertension: Secondary | ICD-10-CM | POA: Diagnosis not present

## 2022-08-22 DIAGNOSIS — Z1211 Encounter for screening for malignant neoplasm of colon: Secondary | ICD-10-CM | POA: Diagnosis not present

## 2022-08-22 DIAGNOSIS — Z862 Personal history of diseases of the blood and blood-forming organs and certain disorders involving the immune mechanism: Secondary | ICD-10-CM | POA: Diagnosis not present

## 2022-08-28 DIAGNOSIS — Z1211 Encounter for screening for malignant neoplasm of colon: Secondary | ICD-10-CM | POA: Diagnosis not present

## 2022-12-17 DIAGNOSIS — M19041 Primary osteoarthritis, right hand: Secondary | ICD-10-CM | POA: Diagnosis not present

## 2022-12-17 DIAGNOSIS — M19042 Primary osteoarthritis, left hand: Secondary | ICD-10-CM | POA: Diagnosis not present

## 2022-12-24 ENCOUNTER — Other Ambulatory Visit: Payer: Self-pay | Admitting: Family Medicine

## 2022-12-24 DIAGNOSIS — Z1231 Encounter for screening mammogram for malignant neoplasm of breast: Secondary | ICD-10-CM

## 2023-01-01 DIAGNOSIS — L738 Other specified follicular disorders: Secondary | ICD-10-CM | POA: Diagnosis not present

## 2023-01-01 DIAGNOSIS — L218 Other seborrheic dermatitis: Secondary | ICD-10-CM | POA: Diagnosis not present

## 2023-01-01 DIAGNOSIS — D1801 Hemangioma of skin and subcutaneous tissue: Secondary | ICD-10-CM | POA: Diagnosis not present

## 2023-01-01 DIAGNOSIS — L570 Actinic keratosis: Secondary | ICD-10-CM | POA: Diagnosis not present

## 2023-01-01 DIAGNOSIS — L308 Other specified dermatitis: Secondary | ICD-10-CM | POA: Diagnosis not present

## 2023-01-01 DIAGNOSIS — L821 Other seborrheic keratosis: Secondary | ICD-10-CM | POA: Diagnosis not present

## 2023-01-01 DIAGNOSIS — B351 Tinea unguium: Secondary | ICD-10-CM | POA: Diagnosis not present

## 2023-01-22 DIAGNOSIS — E063 Autoimmune thyroiditis: Secondary | ICD-10-CM | POA: Diagnosis not present

## 2023-01-22 DIAGNOSIS — E039 Hypothyroidism, unspecified: Secondary | ICD-10-CM | POA: Diagnosis not present

## 2023-01-22 DIAGNOSIS — M8580 Other specified disorders of bone density and structure, unspecified site: Secondary | ICD-10-CM | POA: Diagnosis not present

## 2023-02-13 ENCOUNTER — Ambulatory Visit
Admission: RE | Admit: 2023-02-13 | Discharge: 2023-02-13 | Disposition: A | Discharge status: Home / Self Care | Payer: Medicare Other | Source: Ambulatory Visit | Attending: Family Medicine | Admitting: Family Medicine

## 2023-02-13 DIAGNOSIS — Z1231 Encounter for screening mammogram for malignant neoplasm of breast: Secondary | ICD-10-CM | POA: Diagnosis not present

## 2023-02-17 ENCOUNTER — Ambulatory Visit: Payer: Medicare Other

## 2023-03-13 DIAGNOSIS — E039 Hypothyroidism, unspecified: Secondary | ICD-10-CM | POA: Diagnosis not present

## 2023-04-29 DIAGNOSIS — E039 Hypothyroidism, unspecified: Secondary | ICD-10-CM | POA: Diagnosis not present

## 2023-07-09 DIAGNOSIS — E039 Hypothyroidism, unspecified: Secondary | ICD-10-CM | POA: Diagnosis not present

## 2023-08-12 DIAGNOSIS — H5203 Hypermetropia, bilateral: Secondary | ICD-10-CM | POA: Diagnosis not present

## 2023-08-12 DIAGNOSIS — H43813 Vitreous degeneration, bilateral: Secondary | ICD-10-CM | POA: Diagnosis not present

## 2023-08-12 DIAGNOSIS — H2513 Age-related nuclear cataract, bilateral: Secondary | ICD-10-CM | POA: Diagnosis not present

## 2023-09-02 DIAGNOSIS — R195 Other fecal abnormalities: Secondary | ICD-10-CM | POA: Diagnosis not present

## 2023-09-18 DIAGNOSIS — E039 Hypothyroidism, unspecified: Secondary | ICD-10-CM | POA: Diagnosis not present

## 2023-09-23 DIAGNOSIS — R7303 Prediabetes: Secondary | ICD-10-CM | POA: Diagnosis not present

## 2023-09-23 DIAGNOSIS — E039 Hypothyroidism, unspecified: Secondary | ICD-10-CM | POA: Diagnosis not present

## 2023-09-23 DIAGNOSIS — Z0000 Encounter for general adult medical examination without abnormal findings: Secondary | ICD-10-CM | POA: Diagnosis not present

## 2023-09-23 DIAGNOSIS — R232 Flushing: Secondary | ICD-10-CM | POA: Diagnosis not present

## 2023-09-23 DIAGNOSIS — Z862 Personal history of diseases of the blood and blood-forming organs and certain disorders involving the immune mechanism: Secondary | ICD-10-CM | POA: Diagnosis not present

## 2023-09-23 DIAGNOSIS — Z1211 Encounter for screening for malignant neoplasm of colon: Secondary | ICD-10-CM | POA: Diagnosis not present

## 2023-09-23 DIAGNOSIS — Z136 Encounter for screening for cardiovascular disorders: Secondary | ICD-10-CM | POA: Diagnosis not present

## 2023-09-23 DIAGNOSIS — M85852 Other specified disorders of bone density and structure, left thigh: Secondary | ICD-10-CM | POA: Diagnosis not present

## 2023-09-23 DIAGNOSIS — E559 Vitamin D deficiency, unspecified: Secondary | ICD-10-CM | POA: Diagnosis not present

## 2023-09-23 DIAGNOSIS — Z23 Encounter for immunization: Secondary | ICD-10-CM | POA: Diagnosis not present

## 2023-09-24 DIAGNOSIS — Z1211 Encounter for screening for malignant neoplasm of colon: Secondary | ICD-10-CM | POA: Diagnosis not present

## 2023-10-15 DIAGNOSIS — Z96641 Presence of right artificial hip joint: Secondary | ICD-10-CM | POA: Diagnosis not present

## 2023-11-04 DIAGNOSIS — E039 Hypothyroidism, unspecified: Secondary | ICD-10-CM | POA: Diagnosis not present

## 2024-01-07 DIAGNOSIS — E039 Hypothyroidism, unspecified: Secondary | ICD-10-CM | POA: Diagnosis not present

## 2024-01-12 ENCOUNTER — Other Ambulatory Visit: Payer: Self-pay | Admitting: Family Medicine

## 2024-01-12 DIAGNOSIS — Z1231 Encounter for screening mammogram for malignant neoplasm of breast: Secondary | ICD-10-CM

## 2024-02-18 ENCOUNTER — Ambulatory Visit: Payer: Medicare Other

## 2024-02-27 DIAGNOSIS — E039 Hypothyroidism, unspecified: Secondary | ICD-10-CM | POA: Diagnosis not present

## 2024-03-05 DIAGNOSIS — E039 Hypothyroidism, unspecified: Secondary | ICD-10-CM | POA: Diagnosis not present

## 2024-03-05 DIAGNOSIS — M8580 Other specified disorders of bone density and structure, unspecified site: Secondary | ICD-10-CM | POA: Diagnosis not present

## 2024-03-05 DIAGNOSIS — E063 Autoimmune thyroiditis: Secondary | ICD-10-CM | POA: Diagnosis not present

## 2024-03-09 ENCOUNTER — Ambulatory Visit

## 2024-03-17 ENCOUNTER — Ambulatory Visit
Admission: RE | Admit: 2024-03-17 | Discharge: 2024-03-17 | Disposition: A | Discharge status: Home / Self Care | Source: Ambulatory Visit | Attending: Family Medicine | Admitting: Family Medicine

## 2024-03-17 DIAGNOSIS — Z1231 Encounter for screening mammogram for malignant neoplasm of breast: Secondary | ICD-10-CM | POA: Diagnosis not present

## 2024-04-20 DIAGNOSIS — M85851 Other specified disorders of bone density and structure, right thigh: Secondary | ICD-10-CM | POA: Diagnosis not present

## 2024-04-21 ENCOUNTER — Other Ambulatory Visit: Payer: Self-pay | Admitting: Nurse Practitioner

## 2024-04-21 DIAGNOSIS — M85851 Other specified disorders of bone density and structure, right thigh: Secondary | ICD-10-CM

## 2024-04-30 DIAGNOSIS — E039 Hypothyroidism, unspecified: Secondary | ICD-10-CM | POA: Diagnosis not present

## 2024-04-30 DIAGNOSIS — E063 Autoimmune thyroiditis: Secondary | ICD-10-CM | POA: Diagnosis not present

## 2024-06-16 DIAGNOSIS — E039 Hypothyroidism, unspecified: Secondary | ICD-10-CM | POA: Diagnosis not present

## 2024-07-28 DIAGNOSIS — S40861A Insect bite (nonvenomous) of right upper arm, initial encounter: Secondary | ICD-10-CM | POA: Diagnosis not present

## 2024-08-11 ENCOUNTER — Emergency Department (HOSPITAL_COMMUNITY)

## 2024-08-11 ENCOUNTER — Inpatient Hospital Stay (HOSPITAL_COMMUNITY)
Admission: EM | Admit: 2024-08-11 | Discharge: 2024-08-18 | DRG: 494 | Disposition: A | Discharge status: Skilled Nursing Facility | Attending: Internal Medicine | Admitting: Internal Medicine

## 2024-08-11 ENCOUNTER — Other Ambulatory Visit: Payer: Self-pay

## 2024-08-11 DIAGNOSIS — M79662 Pain in left lower leg: Secondary | ICD-10-CM | POA: Diagnosis not present

## 2024-08-11 DIAGNOSIS — S82843A Displaced bimalleolar fracture of unspecified lower leg, initial encounter for closed fracture: Principal | ICD-10-CM

## 2024-08-11 DIAGNOSIS — Z79890 Hormone replacement therapy (postmenopausal): Secondary | ICD-10-CM

## 2024-08-11 DIAGNOSIS — W108XXA Fall (on) (from) other stairs and steps, initial encounter: Secondary | ICD-10-CM | POA: Diagnosis not present

## 2024-08-11 DIAGNOSIS — Z8249 Family history of ischemic heart disease and other diseases of the circulatory system: Secondary | ICD-10-CM

## 2024-08-11 DIAGNOSIS — Z96641 Presence of right artificial hip joint: Secondary | ICD-10-CM | POA: Diagnosis present

## 2024-08-11 DIAGNOSIS — Z888 Allergy status to other drugs, medicaments and biological substances status: Secondary | ICD-10-CM

## 2024-08-11 DIAGNOSIS — S8252XA Displaced fracture of medial malleolus of left tibia, initial encounter for closed fracture: Secondary | ICD-10-CM | POA: Diagnosis not present

## 2024-08-11 DIAGNOSIS — W109XXA Fall (on) (from) unspecified stairs and steps, initial encounter: Secondary | ICD-10-CM | POA: Diagnosis present

## 2024-08-11 DIAGNOSIS — S82842A Displaced bimalleolar fracture of left lower leg, initial encounter for closed fracture: Secondary | ICD-10-CM | POA: Diagnosis not present

## 2024-08-11 DIAGNOSIS — Z743 Need for continuous supervision: Secondary | ICD-10-CM | POA: Diagnosis not present

## 2024-08-11 DIAGNOSIS — S82852A Displaced trimalleolar fracture of left lower leg, initial encounter for closed fracture: Secondary | ICD-10-CM | POA: Diagnosis not present

## 2024-08-11 DIAGNOSIS — S82899A Other fracture of unspecified lower leg, initial encounter for closed fracture: Secondary | ICD-10-CM | POA: Diagnosis not present

## 2024-08-11 DIAGNOSIS — Y9239 Other specified sports and athletic area as the place of occurrence of the external cause: Secondary | ICD-10-CM

## 2024-08-11 DIAGNOSIS — S82432A Displaced oblique fracture of shaft of left fibula, initial encounter for closed fracture: Secondary | ICD-10-CM | POA: Diagnosis not present

## 2024-08-11 DIAGNOSIS — E039 Hypothyroidism, unspecified: Secondary | ICD-10-CM | POA: Diagnosis present

## 2024-08-11 DIAGNOSIS — S82892A Other fracture of left lower leg, initial encounter for closed fracture: Secondary | ICD-10-CM | POA: Diagnosis not present

## 2024-08-11 MED ORDER — OXYCODONE-ACETAMINOPHEN 5-325 MG PO TABS
1.0000 | ORAL_TABLET | Freq: Four times a day (QID) | ORAL | Status: DC | PRN
  Administered 2024-08-12: 1 via ORAL
  Filled 2024-08-11: qty 1

## 2024-08-11 MED ORDER — OXYCODONE-ACETAMINOPHEN 5-325 MG PO TABS
1.0000 | ORAL_TABLET | Freq: Once | ORAL | Status: AC
  Administered 2024-08-11: 1 via ORAL
  Filled 2024-08-11: qty 1

## 2024-08-11 NOTE — Progress Notes (Signed)
 Awaiting PT eval.

## 2024-08-11 NOTE — ED Provider Notes (Signed)
 [place] EMERGENCY DEPARTMENT AT [facility] Provider Note   CSN: [id number] Arrival date & time: 08/11/24  1449     Patient presents with: Fall and Ankle Pain   Lisa Oliver is a 73 y.o. female.   73 year old female with no significant past medical history presenting to the emergency department today with left ankle pain.  The patient states that she slipped when she was leaving the gym earlier today.  Missed the last step and rolled her ankle.  She has been having pain since.  She did initially have a deformity but states that since she has arrived here that seems to have stabilized.  She came to the emergency department that time for further evaluation.  Denies any numbness or tingling.  She has not tried to walk on this.   Fall  Ankle Pain      Prior to Admission medications   Medication Sig Start Date End Date Taking? Authorizing Provider  ADVIL  200 MG tablet Take 400 mg by mouth every 6 (six) hours as needed for mild pain (pain score 1-3) (or headaches).   Yes [provider]  Cholecalciferol  (VITAMIN D -3) 125 MCG (5000 UT) TABS Take 5,000 Units by mouth daily with breakfast.   Yes [provider]  metroNIDAZOLE (METROGEL) 1 % gel Apply 1 application  topically daily as needed (for rosacea).   Yes [provider]  NATURAL BALANCE TEARS 0.1-0.3 % SOLN Place 1 drop into both eyes at bedtime as needed (DRY/IRRITATED EYES.).   Yes [provider]  SYNTHROID  50 MCG tablet Take 50 mcg by mouth every evening.   Yes [provider]  Digestive Enzymes (DIGESTIVE ENZYME PO) Take 1 capsule by mouth daily with breakfast. Enzyme Forte    [provider]    Allergies: Depo-medrol [methylprednisolone]    Review of Systems  Musculoskeletal:  Positive for arthralgias.  All other systems reviewed and are negative.   Updated Vital Signs BP (!) 175/91 (BP Location: Right Arm)   Pulse 85   Temp (!) 97.4 F (36.3 C)  (Oral)   Resp 16   Ht 5' 9 (1.753 m)   Wt 87.1 kg   SpO2 100%   BMI 28.35 kg/m   Physical Exam Vitals and nursing note reviewed.   Gen: NAD Eyes: PERRL, EOMI HEENT: no oropharyngeal swelling Neck: trachea midline Resp: clear to auscultation bilaterally Card: RRR, no murmurs, rubs, or gallops Abd: nontender, nondistended Extremities: no calf tenderness, no edema, mild swelling noted to the left ankle with tenderness over the medial and lateral joint line, no obvious deformity noted, compartments are soft Vascular: 2+ radial pulses bilaterally, 2+ DP pulses bilaterally Neuro: Patient has equal strength sensation throughout the bilateral lower extremities and is able to move all of the toes on her left foot.  She is able to flex and extend her ankle with pain. Skin: no rashes, no lacerations noted Psyc: acting appropriately   (all labs ordered are listed, but only abnormal results are displayed) Labs Reviewed - No data to display  EKG: None  Radiology: DG Ankle Complete Left Result Date: 08/11/2024 CLINICAL DATA:  Patient tripped and fell down the stairs. EXAM: LEFT ANKLE COMPLETE - 3+ VIEW COMPARISON:  None Available. FINDINGS: Bone mineralization within normal limits for patient's age. There is mildly displaced oblique fracture of the distal left fibula. There is also mildly displaced fracture of the medial malleolus with resultant disruption of ankle mortise. No other acute fracture or dislocation. No  aggressive osseous lesion. Mild diffuse soft tissue swelling about the ankle joint. No radiopaque foreign bodies. IMPRESSION: *Mildly displaced fractures of the distal left fibula and medial malleolus with resultant disruption of ankle mortise. Electronically Signed   By: Ree Molt M.D.   On: 08/11/2024 15:51     Procedures   Medications Ordered in the ED  oxyCODONE -acetaminophen  (PERCOCET/ROXICET) 5-325 MG per tablet 1 tablet (has no administration in time range)   oxyCODONE -acetaminophen  (PERCOCET/ROXICET) 5-325 MG per tablet 1 tablet (1 tablet Oral Given 08/11/24 1801)                                    Medical Decision Making 73 year old female with no reported past medical history presenting to the emergency department today with pain in her left ankle after a fall.  The patient otherwise atraumatic.  I will further evaluate patient here with an x-ray.  Reports that her pain is relatively well-controlled here.  She does seem to be neurovascularly intact.  I will reevaluate for ultimate disposition.  The patient does appear to have a distal fibular fracture here with some disruption of the ankle mortise.  Calls placed to orthopedics to discuss.  I discussed the patient's case with Dr. Germaine.  There are no plans for acute surgical intervention at this time.  Recommends outpatient follow-up.  The patient is unable to ambulate with crutches and does not feel safe going home.  She will be kept in the ER under observation overnight until physical therapy and Occupational Therapy evaluations can be conducted.  Plan is for likely SNF placement.  Amount and/or Complexity of Data Reviewed Radiology: ordered.  Risk Prescription drug management.        Final diagnoses:  Closed bimalleolar fracture, unspecified laterality, initial encounter    ED Discharge Orders     None          Ula Prentice SAUNDERS, MD 08/11/24 2329

## 2024-08-11 NOTE — Progress Notes (Signed)
 Orthopedic Tech Progress Note Patient Details:  Lisa Oliver [birth year] [id number]  Ortho Devices Type of Ortho Device: Ace wrap, Cotton web roll, Post (short leg) splint, Stirrup splint Ortho Device/Splint Location: left short leg posterior and stirrup splint applied. Pt unable to use crutches Ortho Device/Splint Interventions: Ordered, Application, Adjustment   Post Interventions Patient Tolerated: Well Instructions Provided: Adjustment of device, Care of device  [name] 08/11/2024, 5:34 PM

## 2024-08-11 NOTE — ED Notes (Signed)
 PT has reported that she CANNOT ambulate with crutches. Kemp Balm, Jon aware.

## 2024-08-11 NOTE — ED Triage Notes (Signed)
 Pt BIBA from gym, stepped off step, twisted left ankle. No other trauma. 18LF, 25mcg fentanyl . Appears to be less deformity on arrival. Mild swelling, pulse intact.  167/79 HR 64 RR 24 100% RA

## 2024-08-12 ENCOUNTER — Encounter (HOSPITAL_COMMUNITY): Payer: Self-pay | Admitting: Internal Medicine

## 2024-08-12 DIAGNOSIS — S82892A Other fracture of left lower leg, initial encounter for closed fracture: Secondary | ICD-10-CM | POA: Diagnosis not present

## 2024-08-12 DIAGNOSIS — S82899A Other fracture of unspecified lower leg, initial encounter for closed fracture: Secondary | ICD-10-CM | POA: Diagnosis present

## 2024-08-12 LAB — SURGICAL PCR SCREEN
MRSA, PCR: NEGATIVE
Staphylococcus aureus: NEGATIVE

## 2024-08-12 MED ORDER — ONDANSETRON HCL 4 MG PO TABS: 4.0000 mg | ORAL_TABLET | Freq: Four times a day (QID) | ORAL | Status: DC | PRN

## 2024-08-12 MED ORDER — MUPIROCIN 2 % EX OINT: 1.0000 | TOPICAL_OINTMENT | Freq: Two times a day (BID) | CUTANEOUS | Status: DC

## 2024-08-12 MED ORDER — ALBUTEROL SULFATE (2.5 MG/3ML) 0.083% IN NEBU: 2.5000 mg | INHALATION_SOLUTION | RESPIRATORY_TRACT | Status: DC | PRN

## 2024-08-12 MED ORDER — ACETAMINOPHEN 650 MG RE SUPP: 650.0000 mg | Freq: Four times a day (QID) | RECTAL | Status: DC | PRN

## 2024-08-12 MED ORDER — ACETAMINOPHEN 325 MG PO TABS
650.0000 mg | ORAL_TABLET | Freq: Four times a day (QID) | ORAL | Status: DC | PRN
  Administered 2024-08-12 – 2024-08-13 (×2): 650 mg via ORAL
  Filled 2024-08-12 (×2): qty 2

## 2024-08-12 MED ORDER — VITAMIN D 25 MCG (1000 UNIT) PO TABS
5000.0000 [IU] | ORAL_TABLET | Freq: Every day | ORAL | Status: DC
  Administered 2024-08-12 – 2024-08-18 (×7): 5000 [IU] via ORAL
  Filled 2024-08-12 (×7): qty 5

## 2024-08-12 MED ORDER — TRAZODONE HCL 50 MG PO TABS: 25.0000 mg | ORAL_TABLET | Freq: Every evening | ORAL | Status: DC | PRN

## 2024-08-12 MED ORDER — ENOXAPARIN SODIUM 40 MG/0.4ML IJ SOSY
40.0000 mg | PREFILLED_SYRINGE | INTRAMUSCULAR | Status: DC
  Administered 2024-08-12 – 2024-08-13 (×2): 40 mg via SUBCUTANEOUS
  Filled 2024-08-12 (×2): qty 0.4

## 2024-08-12 MED ORDER — LEVOTHYROXINE SODIUM 50 MCG PO TABS
50.0000 ug | ORAL_TABLET | Freq: Every evening | ORAL | Status: DC
  Administered 2024-08-12 – 2024-08-14 (×3): 50 ug via ORAL
  Filled 2024-08-12 (×6): qty 1

## 2024-08-12 MED ORDER — ONDANSETRON HCL 4 MG/2ML IJ SOLN
4.0000 mg | Freq: Four times a day (QID) | INTRAMUSCULAR | Status: DC | PRN
  Filled 2024-08-12: qty 2

## 2024-08-12 MED ORDER — HYDROMORPHONE HCL 1 MG/ML IJ SOLN: 0.5000 mg | INTRAMUSCULAR | Status: DC | PRN

## 2024-08-12 NOTE — Progress Notes (Signed)
 Assuming care for patient from off-going RN until 1930. Agree w/ previously documented shift assessment and daily documentation.

## 2024-08-12 NOTE — Progress Notes (Signed)
 CSW spoke with pt's spouse who reports they are interested in STR at SNF. Per chart review, pt did not ambulate with PT. Pt to be faxed out- bed offers pending.

## 2024-08-12 NOTE — ED Provider Notes (Signed)
 Emergency Medicine Observation Re-evaluation Note  Lisa Oliver is a 73 y.o. female, seen on rounds today.  Pt initially presented to the ED for complaints of Fall and Ankle Pain Currently, the patient is in room eating breakfast.  Physical Exam  BP (!) 175/91 (BP Location: Right Arm)   Pulse 85   Temp (!) 97.4 F (36.3 C) (Oral)   Resp 16   Ht 5' 9 (1.753 m)   Wt 87.1 kg   SpO2 100%   BMI 28.35 kg/m  Physical Exam General: Awake, alert Cardiac: Normal rate Lungs: Normal effort Psych: Mood appropriate  ED Course / MDM  EKG:   I have reviewed the labs performed to date as well as medications administered while in observation.  Recent changes in the last 24 hours include ankle fracture diagnosis, PT evaluation, family looking for short-term rehab.  Spoke with Dr. Kit from orthopedic surgery.  Says that he would be able to operate a the patient on Sunday.  Plan  Current plan is for short-term rehab.    Mannie Pac T, DO 08/12/24 1053

## 2024-08-12 NOTE — NC FL2 (Signed)
 [place]  MEDICAID FL2 LEVEL OF CARE FORM     IDENTIFICATION  Patient Name: ALESE FURNISS Birthdate: [birth year] Sex: female Admission Date (Current Location): 08/11/2024  [facility] and [place] Number:  Producer, television/film/video and Address:  [facility],  [address], [place] [address]      Provider Number: [id number]  Attending Physician Name and Address:  Mannie Fairy DASEN, DO  Relative Name and Phone Number:  Niala, Stcharles (Spouse)  [phone] [facility])    Current Level of Care: Hospital Recommended Level of Care: Skilled Nursing Facility Prior Approval Number:    Date Approved/Denied:   PASRR Number: [id number]  Discharge Plan: SNF    Current Diagnoses: Patient Active Problem List   Diagnosis Date Noted   Overweight (BMI 25.0-29.9) 10/06/2019   Status post right hip replacement 10/05/2019    Orientation RESPIRATION BLADDER Height & Weight     Self, Situation, Place  Normal Continent Weight: 192 lb (87.1 kg) Height:  5' 9 (175.3 cm)  BEHAVIORAL SYMPTOMS/MOOD NEUROLOGICAL BOWEL NUTRITION STATUS      Continent Diet (Regular)  AMBULATORY STATUS COMMUNICATION OF NEEDS Skin   Extensive Assist Verbally Normal                       Personal Care Assistance Level of Assistance  Bathing, Feeding, Dressing Bathing Assistance: Maximum assistance Feeding assistance: Maximum assistance Dressing Assistance: Maximum assistance     Functional Limitations Info  Sight, Hearing, Speech Sight Info: Adequate Hearing Info: Adequate Speech Info: Adequate    SPECIAL CARE FACTORS FREQUENCY  PT (By licensed PT), OT (By licensed OT)     PT Frequency: x5/week OT Frequency: x5/week            Contractures Contractures Info: Not present    Additional Factors Info  Code Status, Allergies Code Status Info: Full Allergies Info: Depo-medrol (Methylprednisolone)           Current Medications (08/12/2024):  This is the current hospital active  medication list Current Facility-Administered Medications  Medication Dose Route Frequency Provider Last Rate Last Admin   cholecalciferol  (VITAMIN D3) 25 MCG (1000 UNIT) tablet 5,000 Units  5,000 Units Oral Q breakfast Rancour, Stephen, MD   5,000 Units at 08/12/24 0849   levothyroxine  (SYNTHROID ) tablet 50 mcg  50 mcg Oral QPM Rancour, Stephen, MD       oxyCODONE -acetaminophen  (PERCOCET/ROXICET) 5-325 MG per tablet 1 tablet  1 tablet Oral Q6H PRN Ula Prentice SAUNDERS, MD   1 tablet at 08/12/24 0930   Current Outpatient Medications  Medication Sig Dispense Refill   ADVIL  200 MG tablet Take 400 mg by mouth every 6 (six) hours as needed for mild pain (pain score 1-3) (or headaches).     Cholecalciferol  (VITAMIN D -3) 125 MCG (5000 UT) TABS Take 5,000 Units by mouth daily with breakfast.     metroNIDAZOLE (METROGEL) 1 % gel Apply 1 application  topically daily as needed (for rosacea).     NATURAL BALANCE TEARS 0.1-0.3 % SOLN Place 1 drop into both eyes at bedtime as needed (DRY/IRRITATED EYES.).     SYNTHROID  50 MCG tablet Take 50 mcg by mouth every evening.     Digestive Enzymes (DIGESTIVE ENZYME PO) Take 1 capsule by mouth daily with breakfast. Enzyme Forte       Discharge Medications: Please see discharge summary for a list of discharge medications.  Relevant Imaging Results:  Relevant Lab Results:   Additional Information SSN:[id number]  Kari JONETTA Daisy, LCSW

## 2024-08-12 NOTE — H&P (Signed)
 History and Physical  [name] FMW:[id number] DOB: Jul 04, 1951 DOA: 08/11/2024  PCP: Seabron Lenis, MD   Chief Complaint: Left ankle pain  HPI: Lisa Oliver is a 73 y.o. female with medical history significant for hypothyroidism had a mechanical fall after leaving the gym 8/27 and sustained left ankle fracture.  She is now being admitted to the hospitalist service in anticipation of surgery on 8/31.  She has been in the ER since yesterday, social work has been involved and she has also been seen by PT/OT, they were hoping to get her placed at a subacute nursing facility however this has not happened yet.  Unfortunately she is too weak in her upper body to manage with crutches, tells me that she cannot navigate with a walker at home.  As such, hospitalist service was asked to admit the patient to the hospital with anticipated surgical repair with Dr. Kit on 8/31.  Review of Systems: Please see HPI for pertinent positives and negatives. A complete 10 system review of systems are otherwise negative.  Past Medical History:  Diagnosis Date   Medical history non-contributory    Past Surgical History:  Procedure Laterality Date   BREAST BIOPSY Left 08/2018   PASH   BREAST LUMPECTOMY WITH RADIOACTIVE SEED LOCALIZATION Left 09/10/2018   Procedure: BREAST LUMPECTOMY WITH RADIOACTIVE SEED LOCALIZATION;  Surgeon: Vanderbilt Ned, MD;  Location: [facility];  Service: General;  Laterality: Left;   BREAST SURGERY     DIAGNOSTIC LAPAROSCOPY     cyst on ovary   DILATION AND CURETTAGE OF UTERUS     TOTAL HIP ARTHROPLASTY Right 10/05/2019   Procedure: TOTAL HIP ARTHROPLASTY ANTERIOR APPROACH;  Surgeon: Ernie Cough, MD;  Location: [facility];  Service: Orthopedics;  Laterality: Right;  70 mins Requests am surgery time   Social History:  reports that she has never smoked. She has never used smokeless tobacco. She reports current alcohol use of about 1.0 standard drink of alcohol per  week. She reports that she does not use drugs.  Allergies  Allergen Reactions   Depo-Medrol [Methylprednisolone] Hives, Itching, Dermatitis, Rash and Other (See Comments)    Able to tolerate oral prednisone, however    Family History  Problem Relation Age of Onset   CAD Mother        MI in 10's with PCI   Hip fracture Father    Pneumonia Father    Breast cancer Neg Hx      Prior to Admission medications   Medication Sig Start Date End Date Taking? Authorizing Provider  ADVIL  200 MG tablet Take 400 mg by mouth every 6 (six) hours as needed for mild pain (pain score 1-3) (or headaches).   Yes [provider]  Cholecalciferol  (VITAMIN D -3) 125 MCG (5000 UT) TABS Take 5,000 Units by mouth daily with breakfast.   Yes [provider]  metroNIDAZOLE (METROGEL) 1 % gel Apply 1 application  topically daily as needed (for rosacea).   Yes [provider]  NATURAL BALANCE TEARS 0.1-0.3 % SOLN Place 1 drop into both eyes at bedtime as needed (DRY/IRRITATED EYES.).   Yes [provider]  SYNTHROID  50 MCG tablet Take 50 mcg by mouth every evening.   Yes [provider]  Digestive Enzymes (DIGESTIVE ENZYME PO) Take 1 capsule by mouth daily with breakfast. Enzyme Forte    [provider]    Physical Exam: BP (!) 175/91 (BP Location: Right Arm)   Pulse 85   Temp (!)  97.4 F (36.3 C) (Oral)   Resp 16   Ht 5' 9 (1.753 m)   Wt 87.1 kg   SpO2 100%   BMI 28.35 kg/m  General:  Alert, oriented, calm, in no acute distress, resting comfortably on room air.  No family at the bedside. Cardiovascular: RRR, no murmurs or rubs, no peripheral edema  Respiratory: clear to auscultation bilaterally, no wheezes, no crackles  Abdomen: soft, nontender, nondistended, normal bowel tones heard  Skin: dry, no rashes  Musculoskeletal: no joint effusions, left lower extremity cast Psychiatric: appropriate affect, normal speech  Neurologic: extraocular muscles  intact, clear speech, moving all extremities with intact sensorium         Labs on Admission:  Basic Metabolic Panel: No results for input(s): NA, K, CL, CO2, GLUCOSE, BUN, CREATININE, CALCIUM, MG, PHOS in the last 168 hours. Liver Function Tests: No results for input(s): AST, ALT, ALKPHOS, BILITOT, PROT, ALBUMIN in the last 168 hours. No results for input(s): LIPASE, AMYLASE in the last 168 hours. No results for input(s): AMMONIA in the last 168 hours. CBC: No results for input(s): WBC, NEUTROABS, HGB, HCT, MCV, PLT in the last 168 hours. Cardiac Enzymes: No results for input(s): CKTOTAL, CKMB, CKMBINDEX, TROPONINI in the last 168 hours. BNP (last 3 results) No results for input(s): BNP in the last 8760 hours.  ProBNP (last 3 results) No results for input(s): PROBNP in the last 8760 hours.  CBG: No results for input(s): GLUCAP in the last 168 hours.  Radiological Exams on Admission: DG Ankle Complete Left Result Date: 08/11/2024 CLINICAL DATA:  Patient tripped and fell down the stairs. EXAM: LEFT ANKLE COMPLETE - 3+ VIEW COMPARISON:  None Available. FINDINGS: Bone mineralization within normal limits for patient's age. There is mildly displaced oblique fracture of the distal left fibula. There is also mildly displaced fracture of the medial malleolus with resultant disruption of ankle mortise. No other acute fracture or dislocation. No aggressive osseous lesion. Mild diffuse soft tissue swelling about the ankle joint. No radiopaque foreign bodies. IMPRESSION: *Mildly displaced fractures of the distal left fibula and medial malleolus with resultant disruption of ankle mortise. Electronically Signed   By: Ree Molt M.D.   On: 08/11/2024 15:51   Assessment/Plan Lisa Oliver is a 73 y.o. female with medical history significant for hypothyroidism had a mechanical fall after leaving the gym 8/27 and sustained left ankle  fracture.  -Inpatient admission -Pain control and nonweightbearing -Continue home Synthroid  -Will plan to make n.p.o. on the evening of 8/30  DVT prophylaxis: Lovenox      Code Status: Full Code  Consults called: ER provider discussed with orthopedic surgery Dr. Kit  Admission status: The appropriate patient status for this patient is INPATIENT. Inpatient status is judged to be reasonable and necessary in order to provide the required intensity of service to ensure the patient's safety. The patient's presenting symptoms, physical exam findings, and initial radiographic and laboratory data in the context of their chronic comorbidities is felt to place them at high risk for further clinical deterioration. Furthermore, it is not anticipated that the patient will be medically stable for discharge from the hospital within 2 midnights of admission.    I certify that at the point of admission it is my clinical judgment that the patient will require inpatient hospital care spanning beyond 2 midnights from the point of admission due to high intensity of service, high risk for further deterioration and high frequency of surveillance required  Time spent: 55 minutes  Keyauna Graefe CHRISTELLA Gail MD [facility] Pager [phone]  If 7PM-7AM, please contact night-coverage [email or web address] Password [email or web address]  08/12/2024, 2:15 PM

## 2024-08-12 NOTE — Progress Notes (Signed)
 Pt's husband called my office requesting that I take care of pt's ankle fracture.  I reviewed the films.  She can be discharged home if she is safe from PT standpoint.  I'll see her back in the office Friday or Monday to schedule surgery.  I spoke with Dr. Mannie to review the plan.

## 2024-08-12 NOTE — Progress Notes (Signed)
 CSW spoke with pt at bedside. Pt expressed that her surgery with Dr. Kit is Sunday. CSW spoke with EDP, Dr. Mannie, who confirmed. Pt did not feel comfortable returning home as it is not safe due to her not being able to ambulate. Pt stated she was concerned about furthering her injury before the surgery. CSW spoke with Dr. Mannie who stated he will try for admission to see if surgery can take place sooner.   Per chart review, pt will be admitted. SNF workup is already started.

## 2024-08-12 NOTE — Evaluation (Signed)
 Physical Therapy Evaluation Patient Details Name: Lisa Oliver MRN: [id number] DOB: 1951/08/14 Today's Date: 08/12/2024  History of Present Illness  73 y.o. female presented 08/11/24 wtih L distal fibular fx. Plan is for outpt orthopedic f/u. 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  Clinical Impression  Pt admitted with above diagnosis. At baseline pt is independent with ambulation, she swims and works out at [employer] regularly. Today pt required min assist for supine to sit, and for sit to stand with a walker. She was able to stand with walker ~5 seconds x 2 trials, standing tolerance limited by fatigue, she was tremulous in standing. She lacks the strength and activity tolerance to manage transfers. At present she's not mobilizing well enough to DC home. She stated her husband cannot lift her to a wheelchair or to a bedside commode. Patient will benefit from continued inpatient follow up therapy, <3 hours/day.  Pt currently with functional limitations due to the deficits listed below (see PT Problem List). Pt will benefit from acute skilled PT to increase their independence and safety with mobility to allow discharge.           If plan is discharge home, recommend the following: A lot of help with walking and/or transfers;A lot of help with bathing/dressing/bathroom;Assistance with cooking/housework;Assist for transportation;Help with stairs or ramp for entrance   Can travel by private vehicle   No    Equipment Recommendations Wheelchair (measurements PT);Wheelchair cushion (measurements PT)  Recommendations for Other Services       Functional Status Assessment Patient has had a recent decline in their functional status and demonstrates the ability to make significant improvements in function in a reasonable and predictable amount of time.     Precautions / Restrictions Precautions Precautions: Fall Recall of Precautions/Restrictions: Intact Restrictions Weight Bearing Restrictions Per Provider  Order: Yes LLE Weight Bearing Per Provider Order: Non weight bearing      Mobility  Bed Mobility Overal bed mobility: Needs Assistance Bed Mobility: Supine to Sit, Sit to Supine     Supine to sit: HOB elevated, Min assist Sit to supine: Min assist   General bed mobility comments: min A to support LLE in/out of bed    Transfers Overall transfer level: Needs assistance Equipment used: Rolling walker (2 wheels) Transfers: Sit to/from Stand Sit to Stand: Min assist, From elevated surface           General transfer comment: sit to stand x 2 trials, min A to power up from elevated bed, pt tremulous in standing, only able to stand ~5 seconds 2* fatigue    Ambulation/Gait                  Stairs            Wheelchair Mobility     Tilt Bed    Modified Rankin (Stroke Patients Only)       Balance Overall balance assessment: Needs assistance, History of Falls Sitting-balance support: Feet unsupported, No upper extremity supported Sitting balance-Leahy Scale: Good     Standing balance support: Bilateral upper extremity supported, Reliant on assistive device for balance, During functional activity Standing balance-Leahy Scale: Poor                               Pertinent Vitals/Pain Pain Assessment Pain Assessment: 0-10 Pain Score: 8  Pain Location: L ankle Pain Descriptors / Indicators: Grimacing, Guarding, Sharp Pain Intervention(s): Limited activity within patient's  tolerance, Monitored during session, Patient requesting pain meds-RN notified, Repositioned    Home Living Family/patient expects to be discharged to:: Private residence Living Arrangements: Spouse/significant other Available Help at Discharge: Family;Available 24 hours/day Type of Home: House Home Access: Stairs to enter Entrance Stairs-Rails: Right Entrance Stairs-Number of Steps: 4 steps without rails   Home Layout: One level Home Equipment: Agricultural consultant (2  wheels);BSC/3in1      Prior Function Prior Level of Function : Independent/Modified Independent             Mobility Comments: swims, goes to gym, walks without AD ADLs Comments: independent     Extremity/Trunk Assessment   Upper Extremity Assessment Upper Extremity Assessment: Overall WFL for tasks assessed    Lower Extremity Assessment Lower Extremity Assessment: RLE deficits/detail;LLE deficits/detail RLE Deficits / Details: knee ext +4/5 RLE Sensation: WNL LLE Deficits / Details: can wiggle toes, reports some tingling in toes; cannot perform SLR; L ankle in splint LLE: Unable to fully assess due to immobilization;Unable to fully assess due to pain LLE Sensation: decreased light touch    Cervical / Trunk Assessment Cervical / Trunk Assessment: Normal  Communication   Communication Communication: No apparent difficulties    Cognition Arousal: Alert Behavior During Therapy: WFL for tasks assessed/performed, Anxious   PT - Cognitive impairments: No apparent impairments                         Following commands: Intact       Cueing Cueing Techniques: Verbal cues     General Comments      Exercises     Assessment/Plan    PT Assessment Patient needs continued PT services  PT Problem List Decreased activity tolerance       PT Treatment Interventions Therapeutic exercise;Gait training;Therapeutic activities;Functional mobility training;Patient/family education    PT Goals (Current goals can be found in the Care Plan section)  Acute Rehab PT Goals Patient Stated Goal: return to swimming and strengthening legs at gym PT Goal Formulation: With patient Time For Goal Achievement: 08/26/24 Potential to Achieve Goals: Good    Frequency Min 3X/week     Co-evaluation               AM-PAC PT 6 Clicks Mobility  Outcome Measure Help needed turning from your back to your side while in a flat bed without using bedrails?: A Little Help  needed moving from lying on your back to sitting on the side of a flat bed without using bedrails?: A Little Help needed moving to and from a bed to a chair (including a wheelchair)?: A Lot Help needed standing up from a chair using your arms (e.g., wheelchair or bedside chair)?: A Lot Help needed to walk in hospital room?: Total Help needed climbing 3-5 steps with a railing? : Total 6 Click Score: 12    End of Session Equipment Utilized During Treatment: Gait belt Activity Tolerance: Patient limited by pain;Patient limited by fatigue Patient left: in bed;with call bell/phone within reach Nurse Communication: Mobility status PT Visit Diagnosis: Difficulty in walking, not elsewhere classified (R26.2);Pain;History of falling (Z91.81);Other abnormalities of gait and mobility (R26.89);Unsteadiness on feet (R26.81) Pain - Right/Left: Left Pain - part of body: Ankle and joints of foot    Time: 0902-0931 PT Time Calculation (min) (ACUTE ONLY): 29 min   Charges:   PT Evaluation $PT Eval Moderate Complexity: 1 Mod PT Treatments $Therapeutic Activity: 8-22 mins PT General Charges $$ ACUTE PT VISIT:  1 Visit         Sylvan Delon Copp PT 08/12/2024  Acute Rehabilitation Services  Office [phone]

## 2024-08-12 NOTE — Plan of Care (Signed)

## 2024-08-13 DIAGNOSIS — S82892A Other fracture of left lower leg, initial encounter for closed fracture: Secondary | ICD-10-CM | POA: Diagnosis not present

## 2024-08-13 LAB — CBC
HCT: 38.7 % (ref 36.0–46.0)
Hemoglobin: 12.5 g/dL (ref 12.0–15.0)
MCH: 28.9 pg (ref 26.0–34.0)
MCHC: 32.3 g/dL (ref 30.0–36.0)
MCV: 89.6 fL (ref 80.0–100.0)
Platelets: 264 K/uL (ref 150–400)
RBC: 4.32 MIL/uL (ref 3.87–5.11)
RDW: 14.6 % (ref 11.5–15.5)
WBC: 6.9 K/uL (ref 4.0–10.5)
nRBC: 0 % (ref 0.0–0.2)

## 2024-08-13 LAB — BASIC METABOLIC PANEL WITH GFR
Anion gap: 12 (ref 5–15)
BUN: 10 mg/dL (ref 8–23)
CO2: 23 mmol/L (ref 22–32)
Calcium: 9.1 mg/dL (ref 8.9–10.3)
Chloride: 104 mmol/L (ref 98–111)
Creatinine, Ser: 0.76 mg/dL (ref 0.44–1.00)
GFR, Estimated: >60 mL/min (ref >60)
Glucose, Bld: 113 mg/dL — ABNORMAL HIGH (ref 70–99)
Potassium: 4.1 mmol/L (ref 3.5–5.1)
Sodium: 138 mmol/L (ref 135–145)

## 2024-08-13 MED ORDER — METHOCARBAMOL 500 MG PO TABS: 500.0000 mg | ORAL_TABLET | Freq: Four times a day (QID) | ORAL | Status: DC | PRN

## 2024-08-13 NOTE — Progress Notes (Signed)
 Physical Therapy Treatment Patient Details Name: Lisa Oliver MRN: [id number] DOB: October 21, 1951 Today's Date: 08/13/2024   History of Present Illness 73 y.o. female presented 08/11/24 with L distal fibular fx. Plan is now for ortho surgery on Sunday 8/31 per pt.  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    PT Comments  Pt assisted with standing at bedside x2.  Pt continues to required assist for bed mobility and transfers however does well maintaining NWB status.  Pt reports plan for ankle surgery on Sunday.  Anticipate pt to remain NWB and continue to require assist after surgery, so likely patient will benefit from continued inpatient follow up therapy, <3 hours/day upon d/c, however PT to continue to work with pt during admission.     If plan is discharge home, recommend the following: A lot of help with walking and/or transfers;A lot of help with bathing/dressing/bathroom;Assistance with cooking/housework;Assist for transportation;Help with stairs or ramp for entrance   Can travel by private vehicle     No  Equipment Recommendations  Wheelchair (measurements PT);Wheelchair cushion (measurements PT)    Recommendations for Other Services       Precautions / Restrictions Precautions Precautions: Fall Recall of Precautions/Restrictions: Intact Restrictions Weight Bearing Restrictions Per Provider Order: Yes LLE Weight Bearing Per Provider Order: Non weight bearing     Mobility  Bed Mobility Overal bed mobility: Needs Assistance Bed Mobility: Supine to Sit, Sit to Supine     Supine to sit: HOB elevated, Min assist Sit to supine: Min assist   General bed mobility comments: min A to support LLE in/out of bed    Transfers Overall transfer level: Needs assistance Equipment used: Rolling walker (2 wheels) Transfers: Sit to/from Stand Sit to Stand: Mod assist, From elevated surface           General transfer comment: verbal cues for technique, pt unable to raise buttocks from bed surface  with first 2 attempts, mod assist provided and pt able to stand approx 30 sec - performed twice; pt does well maintaining NWB status    Ambulation/Gait                   Stairs             Wheelchair Mobility     Tilt Bed    Modified Rankin (Stroke Patients Only)       Balance Overall balance assessment: Needs assistance, History of Falls         Standing balance support: Bilateral upper extremity supported, Reliant on assistive device for balance, During functional activity Standing balance-Leahy Scale: Poor                              Communication Communication Communication: No apparent difficulties  Cognition Arousal: Alert Behavior During Therapy: WFL for tasks assessed/performed, Anxious   PT - Cognitive impairments: No apparent impairments                         Following commands: Intact      Cueing Cueing Techniques: Verbal cues  Exercises      General Comments        Pertinent Vitals/Pain Pain Assessment Pain Assessment: 0-10 Pain Score: 6  Pain Location: L ankle - dependent position Pain Descriptors / Indicators: Sharp, Tender, Aching, Sore Pain Intervention(s): Repositioned, Monitored during session (elevated end of session)    Home Living  Prior Function            PT Goals (current goals can now be found in the care plan section) Progress towards PT goals: Progressing toward goals    Frequency    Min 3X/week      PT Plan      Co-evaluation              AM-PAC PT 6 Clicks Mobility   Outcome Measure  Help needed turning from your back to your side while in a flat bed without using bedrails?: A Little Help needed moving from lying on your back to sitting on the side of a flat bed without using bedrails?: A Little Help needed moving to and from a bed to a chair (including a wheelchair)?: A Lot Help needed standing up from a chair using your  arms (e.g., wheelchair or bedside chair)?: A Lot Help needed to walk in hospital room?: Total Help needed climbing 3-5 steps with a railing? : Total 6 Click Score: 12    End of Session Equipment Utilized During Treatment: Gait belt Activity Tolerance: Patient tolerated treatment well;Patient limited by fatigue Patient left: in bed;with call bell/phone within reach;with bed alarm set Nurse Communication: Mobility status PT Visit Diagnosis: Difficulty in walking, not elsewhere classified (R26.2);Muscle weakness (generalized) (M62.81)     Time: 8890-8865 PT Time Calculation (min) (ACUTE ONLY): 25 min  Charges:    $Therapeutic Activity: 23-37 mins PT General Charges $$ ACUTE PT VISIT: 1 Visit                     Lisa Oliver PT, DPT Physical Therapist Acute Rehabilitation Services Office: [phone]    Lisa Oliver 08/13/2024, 3:08 PM

## 2024-08-13 NOTE — Progress Notes (Signed)
 PROGRESS NOTE    Lisa Oliver  FMW:[id number] DOB: 24-Jul-1951 DOA: 08/11/2024 PCP: Seabron Lenis, MD    Brief Narrative:   Lisa Oliver is a 73 y.o. female with past medical history significant for hypothyroidism who presented to [facility] ED on 08/11/2024 after sustaining a mechanical fall while leaving the gym with left ankle pain.  Workup in the ED with imaging findings consistent with mildly displaced fracture distal left fibula, medial malleolus with disruption of ankle mortise. She has been in the ER since yesterday, social work has been involved and she has also been seen by PT/OT, they were hoping to get her placed at a subacute nursing facility however this has not happened yet. Unfortunately she is too weak in her upper body to manage with crutches, tells me that she cannot navigate with a walker at home. As such, hospitalist service was asked to admit the patient to the hospital with anticipated surgical repair with Dr. Kit on 8/31.   Assessment & Plan:   Left ankle fracture Patient presenting to ED after mechanical fall with subsequent left ankle pain.  Imaging on arrival notable for mildly displaced fracture of distal left fibula, medial malleolus with the reduction of the ankle mortise. -- Orthopedics following, appreciate assistance -- NWB LLE -- Percocet 5-325 mg p.o. every 6 hours as needed moderate pain -- Dilaudid  0.5-1 mg IV every 2 hours as needed severe pain -- Robaxin  5 mg p.o. every 6 hours PRN muscle spasms -- Orthopedics anticipates surgery intervention on Sunday 08/15/2024  Hypothyroidism -- Levothyroxine  50 mcg p.o. daily   DVT prophylaxis: enoxaparin  (LOVENOX ) injection 40 mg Start: 08/12/24 2200    Code Status: Full Code Family Communication: No family present at bedside  Disposition Plan:  Level of care: Med-Surg Status is: Observation The patient remains OBS appropriate and will d/c before 2 midnights.    Consultants:  Orthopedics, Dr.  Kit  Procedures:  None  Antimicrobials:  None   Subjective: Patient seen examined bedside, lying in bed.  Pain controlled.  No complaints this morning.  Awaiting for operative management, plan for Sunday.  Denies headache, no dizziness, no chest pain, no shortness of breath, no abdominal pain, no fever/chills/night sweats, no nausea/vomitus diarrhea.  No acute events overnight per nurse staff.  Objective: Vitals:   08/12/24 2116 08/13/24 0122 08/13/24 0528 08/13/24 0906  BP: 126/67 (!) 143/74 (!) 146/83 131/66  Pulse: 72 74 74 84  Resp: 16 16 16 16   Temp: 98.1 F (36.7 C) 98.1 F (36.7 C) 97.8 F (36.6 C) 98.1 F (36.7 C)  TempSrc: Oral Oral Oral Oral  SpO2: 96% 95% 96% 95%  Weight:      Height:        Intake/Output Summary (Last 24 hours) at 08/13/2024 1033 Last data filed at 08/13/2024 1000 Gross per 24 hour  Intake 480 ml  Output 1525 ml  Net -1045 ml   Filed Weights   08/11/24 1519  Weight: 87.1 kg    Examination:  Physical Exam: GEN: NAD, alert and oriented x 3, wd/wn HEENT: NCAT, PERRL, EOMI, sclera clear, MMM PULM: CTAB w/o wheezes/crackles, normal respiratory effort, room air CV: RRR w/o M/G/R GI: abd soft, NTND, NABS, no R/G/M MSK: no peripheral edema, left foot/ankle with splint/Ace wrap in place, neurovascularly intact NEURO: CN II-XII intact, no focal deficits, sensation to light touch intact PSYCH: normal mood/affect Integumentary: dry/intact, no rashes or wounds    Data Reviewed: I have personally reviewed following labs and imaging  studies  CBC: Recent Labs  Lab 08/13/24 0317  WBC 6.9  HGB 12.5  HCT 38.7  MCV 89.6  PLT 264   Basic Metabolic Panel: Recent Labs  Lab 08/13/24 0317  NA 138  K 4.1  CL 104  CO2 23  GLUCOSE 113*  BUN 10  CREATININE 0.76  CALCIUM 9.1   GFR: Estimated Creatinine Clearance: 71.6 mL/min (by C-G formula based on SCr of 0.76 mg/dL). Liver Function Tests: No results for input(s): AST, ALT,  ALKPHOS, BILITOT, PROT, ALBUMIN in the last 168 hours. No results for input(s): LIPASE, AMYLASE in the last 168 hours. No results for input(s): AMMONIA in the last 168 hours. Coagulation Profile: No results for input(s): INR, PROTIME in the last 168 hours. Cardiac Enzymes: No results for input(s): CKTOTAL, CKMB, CKMBINDEX, TROPONINI in the last 168 hours. BNP (last 3 results) No results for input(s): PROBNP in the last 8760 hours. HbA1C: No results for input(s): HGBA1C in the last 72 hours. CBG: No results for input(s): GLUCAP in the last 168 hours. Lipid Profile: No results for input(s): CHOL, HDL, LDLCALC, TRIG, CHOLHDL, LDLDIRECT in the last 72 hours. Thyroid  Function Tests: No results for input(s): TSH, T4TOTAL, FREET4, T3FREE, THYROIDAB in the last 72 hours. Anemia Panel: No results for input(s): VITAMINB12, FOLATE, FERRITIN, TIBC, IRON, RETICCTPCT in the last 72 hours. Sepsis Labs: No results for input(s): PROCALCITON, LATICACIDVEN in the last 168 hours.  Recent Results (from the past 240 hours)  Surgical PCR screen     Status: None   Collection Time: 08/12/24  4:58 PM   Specimen: Nasal Mucosa; Nasal Swab  Result Value Ref Range Status   MRSA, PCR NEGATIVE NEGATIVE Final   Staphylococcus aureus NEGATIVE NEGATIVE Final    Comment: (NOTE) The Xpert SA Assay (FDA approved for NASAL specimens in patients 61 years of age and older), is one component of a comprehensive surveillance program. It is not intended to diagnose infection nor to guide or monitor treatment. Performed at [facility], [address], [place], [place] [address]          Radiology Studies: DG Ankle Complete Left Result Date: 08/11/2024 CLINICAL DATA:  Patient tripped and fell down the stairs. EXAM: LEFT ANKLE COMPLETE - 3+ VIEW COMPARISON:  None Available. FINDINGS: Bone mineralization within normal limits for  patient's age. There is mildly displaced oblique fracture of the distal left fibula. There is also mildly displaced fracture of the medial malleolus with resultant disruption of ankle mortise. No other acute fracture or dislocation. No aggressive osseous lesion. Mild diffuse soft tissue swelling about the ankle joint. No radiopaque foreign bodies. IMPRESSION: *Mildly displaced fractures of the distal left fibula and medial malleolus with resultant disruption of ankle mortise. Electronically Signed   By: Ree Molt M.D.   On: 08/11/2024 15:51        Scheduled Meds:  cholecalciferol   5,000 Units Oral Q breakfast   enoxaparin  (LOVENOX ) injection  40 mg Subcutaneous Q24H   levothyroxine   50 mcg Oral QPM   Continuous Infusions:   LOS: 1 day    Time spent: 46 minutes spent on 08/13/2024 caring for this patient face-to-face including chart review, ordering labs/tests, documenting, discussion with nursing staff, consultants, updating family and interview/physical exam    Camellia PARAS [place], DO [facility] Available via Epic secure chat 7am-7pm After these hours, please refer to coverage provider listed on [email or web address] 08/13/2024, 10:33 AM

## 2024-08-13 NOTE — Plan of Care (Signed)

## 2024-08-13 NOTE — Care Management CC44 (Signed)
 Condition Code 44 Documentation Completed  Patient Details  Name: PALMIRA STICKLE MRN: [id number] Date of Birth: 1951/01/19   Condition Code 44 given:  Yes Patient signature on Condition Code 44 notice:  Yes Documentation of 2 MD's agreement:  Yes Code 44 added to claim:  Yes    Duwaine GORMAN Aran, LCSW 08/13/2024, 10:57 AM

## 2024-08-13 NOTE — TOC Initial Note (Signed)
 Transition of Care [facility]) - Initial/Assessment Note   Patient Details  Name: Lisa Oliver MRN: [id number] Date of Birth: January 21, 1951  [facility] of [facility]) CM/SW Contact:    Duwaine GORMAN Aran, LCSW Phone Number: 08/13/2024, 11:00 AM  Clinical Narrative: CSW met with patient and spouse to discuss discharge planning. Patient was initially recommended for SNF in the ED, but is now scheduled for surgery on 08/15/24. CSW explained care management will follow up after PT sees the patient again after surgery.  Expected Discharge Plan: Skilled Nursing Facility Barriers to Discharge: Continued Medical Work up  Patient Goals and CMS Choice Patient states their goals for this hospitalization and ongoing recovery are:: Rehab if possible  Expected Discharge Plan and Services In-house Referral: Clinical Social Work Living arrangements for the past 2 months: Single Family Home  Prior Living Arrangements/Services Living arrangements for the past 2 months: Single Family Home Lives with:: Spouse Patient language and need for interpreter reviewed:: Yes Do you feel safe going back to the place where you live?: Yes      Need for Family Participation in Patient Care: No (Comment) Care giver support system in place?: Yes (comment) Criminal Activity/Legal Involvement Pertinent to Current Situation/Hospitalization: No - Comment as needed  Activities of Daily Living ADL Screening (condition at time of admission) Independently performs ADLs?: Yes (appropriate for developmental age) Is the patient deaf or have difficulty hearing?: No Does the patient have difficulty seeing, even when wearing glasses/contacts?: No Does the patient have difficulty concentrating, remembering, or making decisions?: No  Emotional Assessment Appearance:: Appears stated age Attitude/Demeanor/Rapport: Engaged Affect (typically observed): Accepting Orientation: : Oriented to Self, Oriented to Place, Oriented to  Time, Oriented to  Situation Alcohol / Substance Use: Not Applicable Psych Involvement: No (comment)  Admission diagnosis:  Ankle fracture [S82.899A] Closed bimalleolar fracture, unspecified laterality, initial encounter [D17.156J] Patient Active Problem List   Diagnosis Date Noted   Ankle fracture 08/12/2024   Overweight (BMI 25.0-29.9) 10/06/2019   Status post right hip replacement 10/05/2019   PCP:  Seabron Lenis, MD Pharmacy:   [id number] [address] [place], [place] - [address] [place] [address] Phone: [phone] Fax: [phone]  Social Drivers of Health (SDOH) Social History: SDOH Screenings   Food Insecurity: No Food Insecurity (08/12/2024)  Housing: Low Risk  (08/12/2024)  Transportation Needs: No Transportation Needs (08/12/2024)  Utilities: Not At Risk (08/12/2024)  Social Connections: Socially Integrated (08/12/2024)  Tobacco Use: Low Risk  (08/12/2024)   SDOH Interventions: Food Insecurity Interventions: Intervention Not Indicated Housing Interventions: Intervention Not Indicated Transportation Interventions: Intervention Not Indicated Social Connections Interventions: Intervention Not Indicated  Readmission Risk Interventions     No data to display

## 2024-08-13 NOTE — Care Management Obs Status (Signed)
 MEDICARE OBSERVATION STATUS NOTIFICATION   Patient Details  Name: Lisa Oliver MRN: [id number] Date of Birth: 14-Jun-1951   Medicare Observation Status Notification Given:  Yes    Duwaine GORMAN Aran, LCSW 08/13/2024, 10:56 AM

## 2024-08-14 DIAGNOSIS — S82892A Other fracture of left lower leg, initial encounter for closed fracture: Secondary | ICD-10-CM | POA: Diagnosis not present

## 2024-08-14 LAB — MRSA NEXT GEN BY PCR, NASAL: MRSA by PCR Next Gen: NOT DETECTED

## 2024-08-14 MED ORDER — SODIUM CHLORIDE 0.9 % IV SOLN: INTRAVENOUS | Status: AC

## 2024-08-14 MED ORDER — ENSURE PRE-SURGERY PO LIQD
296.0000 mL | Freq: Once | ORAL | Status: DC
  Filled 2024-08-14: qty 296

## 2024-08-14 NOTE — Anesthesia Preprocedure Evaluation (Signed)
 Anesthesia Evaluation  Patient identified by MRN, date of birth, ID band Patient awake    Reviewed: Allergy & Precautions, NPO status , Patient's Chart, lab work & pertinent test results  History of Anesthesia Complications Negative for: history of anesthetic complications  Airway Mallampati: II  TM Distance: >3 FB Neck ROM: Full    Dental no notable dental hx.    Pulmonary neg pulmonary ROS   Pulmonary exam normal        Cardiovascular negative cardio ROS Normal cardiovascular exam     Neuro/Psych negative neurological ROS     GI/Hepatic negative GI ROS, Neg liver ROS,,,  Endo/Other  Hypothyroidism    Renal/GU negative Renal ROS  negative genitourinary   Musculoskeletal Left ankle fracture   Abdominal   Peds  Hematology negative hematology ROS (+)   Anesthesia Other Findings Day of surgery medications reviewed with patient.  Reproductive/Obstetrics negative OB ROS                              Anesthesia Physical Anesthesia Plan  ASA: 2  Anesthesia Plan: General   Post-op Pain Management: Regional block* and Tylenol  PO (pre-op)*   Induction: Intravenous  PONV Risk Score and Plan: 3 and Treatment may vary due to age or medical condition, Ondansetron , Dexamethasone , Propofol  infusion and TIVA  Airway Management Planned: LMA  Additional Equipment: None  Intra-op Plan:   Post-operative Plan: Extubation in OR  Informed Consent: I have reviewed the patients History and Physical, chart, labs and discussed the procedure including the risks, benefits and alternatives for the proposed anesthesia with the patient or authorized representative who has indicated his/her understanding and acceptance.     Dental advisory given  Plan Discussed with: CRNA  Anesthesia Plan Comments:          Anesthesia Quick Evaluation

## 2024-08-14 NOTE — Plan of Care (Signed)

## 2024-08-14 NOTE — Progress Notes (Addendum)
    Subjective: Patient seen in rounds for Dr. Kit.  Patient reports pain as mild.  Denies N/V/CP/SOB/Abd pain. She is sitting up in bed eating breakfast. She reports her pain is well controlled and she is resting comfortably.   Objective:   VITALS:   Vitals:   08/13/24 0906 08/13/24 1310 08/13/24 2128 08/14/24 0541  BP: 131/66 (!) 149/83 130/67 138/74  Pulse: 84 76 78 67  Resp: 16 16 16 16   Temp: 98.1 F (36.7 C) 97.8 F (36.6 C) 98.9 F (37.2 C) 97.8 F (36.6 C)  TempSrc: Oral Oral Oral Oral  SpO2: 95% 97% 96% 96%  Weight:      Height:        NAD ABD soft LLE in short leg splint.  She can wiggle toes well on exam, DP pulse 2+, capillary refill <2 seconds. She has sensation at toes and dorsum of her foot and anterior knee.   Lab Results  Component Value Date   WBC 6.9 08/13/2024   HGB 12.5 08/13/2024   HCT 38.7 08/13/2024   MCV 89.6 08/13/2024   PLT 264 08/13/2024   BMET    Component Value Date/Time   NA 138 08/13/2024 0317   K 4.1 08/13/2024 0317   CL 104 08/13/2024 0317   CO2 23 08/13/2024 0317   GLUCOSE 113 (H) 08/13/2024 0317   BUN 10 08/13/2024 0317   CREATININE 0.76 08/13/2024 0317   CALCIUM 9.1 08/13/2024 0317   GFRNONAA >60 08/13/2024 0317     Assessment/Plan:     Principal Problem:   Ankle fracture   NWB LLE with walker or crutches.  Plan for surgery tomorrow with Dr. Kit.  NPO midnight tonight.   Lisa Oliver 08/14/2024, 8:25 AM   [facility]  [place] [address], [place], [place] [address] Phone: [phone] [email or web address] Facebook  [employer]

## 2024-08-14 NOTE — Consult Note (Signed)
 Reason for Consult:  left ankle injury Referring Physician: Dr. [place]  Lisa Oliver is an 73 y.o. female.  HPI: 73 y/o female without siginficant PMH c/o left ankle pain since a fall Wednesday.  She went to the ER at [facility].  Initially the ER doc planned to discharge her to home or acute rehab with outpatient follow up.  She ended up being admitted.  She called my office yesterday with questions about her surgery.  She fell on the stairs injuring her ankle.  She c/o aching pain in the ankle.  She does not take blood thinners.  She is not diabetic.  She is not a smoker.  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   BREAST LUMPECTOMY WITH RADIOACTIVE SEED LOCALIZATION Left 09/10/2018   Procedure: BREAST LUMPECTOMY WITH RADIOACTIVE SEED LOCALIZATION;  Surgeon: Vanderbilt Ned, MD;  Location: [facility];  Service: General;  Laterality: Left;   BREAST SURGERY     DIAGNOSTIC LAPAROSCOPY     cyst on ovary   DILATION AND CURETTAGE OF UTERUS     TOTAL HIP ARTHROPLASTY Right 10/05/2019   Procedure: TOTAL HIP ARTHROPLASTY ANTERIOR APPROACH;  Surgeon: Ernie Cough, MD;  Location: [facility];  Service: Orthopedics;  Laterality: Right;  70 mins Requests am surgery time    Family History  Problem Relation Age of Onset   CAD Mother        MI in 65's with PCI   Hip fracture Father    Pneumonia Father    Breast cancer Neg Hx     Social History:  reports that she has never smoked. She has never used smokeless tobacco. She reports current alcohol use of about 1.0 standard drink of alcohol per week. She reports that she does not use drugs.  Allergies:  Allergies  Allergen Reactions   Depo-Medrol [Methylprednisolone] Hives, Itching, Dermatitis, Rash and Other (See Comments)    Able to tolerate oral prednisone, however    Medications: I have reviewed the  patient's current medications.  Results for orders placed or performed during the hospital encounter of 08/11/24 (from the past 48 hours)  Surgical PCR screen     Status: None   Collection Time: 08/12/24  4:58 PM   Specimen: Nasal Mucosa; Nasal Swab  Result Value Ref Range   MRSA, PCR NEGATIVE NEGATIVE   Staphylococcus aureus NEGATIVE NEGATIVE    Comment: (NOTE) The Xpert SA Assay (FDA approved for NASAL specimens in patients 84 years of age and older), is one component of a comprehensive surveillance program. It is not intended to diagnose infection nor to guide or monitor treatment. Performed at [facility], [address] [place], [place] [address]   Basic metabolic panel     Status: Abnormal   Collection Time: 08/13/24  3:17 AM  Result Value Ref Range   Sodium 138 135 - 145 mmol/L   Potassium 4.1 3.5 - 5.1 mmol/L   Chloride 104 98 - 111 mmol/L   CO2 23 22 - 32 mmol/L   Glucose, Bld 113 (H) 70 - 99 mg/dL    Comment: Glucose reference range applies only to samples taken after fasting for at least 8 hours.   BUN 10 8 - 23 mg/dL   Creatinine, Ser 9.23 0.44 - 1.00 mg/dL   Calcium 9.1 8.9 - 89.6 mg/dL   GFR,  Estimated >60 >60 mL/min    Comment: (NOTE) Calculated using the CKD-EPI Creatinine Equation (2021)    Anion gap 12 5 - 15    Comment: Performed at [facility], [address], [place], [place] [address]  CBC     Status: None   Collection Time: 08/13/24  3:17 AM  Result Value Ref Range   WBC 6.9 4.0 - 10.5 K/uL   RBC 4.32 3.87 - 5.11 MIL/uL   Hemoglobin 12.5 12.0 - 15.0 g/dL   HCT 61.2 63.9 - 53.9 %   MCV 89.6 80.0 - 100.0 fL   MCH 28.9 26.0 - 34.0 pg   MCHC 32.3 30.0 - 36.0 g/dL   RDW 85.3 88.4 - 84.4 %   Platelets 264 150 - 400 K/uL   nRBC 0.0 0.0 - 0.2 %    Comment: Performed at [facility], [address], [place], [place] [address]    No results found.  ROS:  no recent f/c/n/v/wt loss PE:  Blood  pressure 138/74, pulse 67, temperature 97.8 F (36.6 C), temperature source Oral, resp. rate 16, height 5' 9 (1.753 m), weight 87.1 kg, SpO2 96%.   Assessment/Plan: L ankle bimal fracture - I posted her for OR Sunday morning and entered her pre op orders.  NPO after midnight.  Lisa Oliver 08/14/2024, 7:05 AM

## 2024-08-14 NOTE — Progress Notes (Signed)
 PROGRESS NOTE    Lisa Oliver  FMW:[id number] DOB: 1951/01/22 DOA: 08/11/2024 PCP: Seabron Lenis, MD    Brief Narrative:   Lisa Oliver is a 73 y.o. female with past medical history significant for hypothyroidism who presented to [facility] ED on 08/11/2024 after sustaining a mechanical fall while leaving the gym with left ankle pain.  Workup in the ED with imaging findings consistent with mildly displaced fracture distal left fibula, medial malleolus with disruption of ankle mortise. She has been in the ER since yesterday, social work has been involved and she has also been seen by PT/OT, they were hoping to get her placed at a subacute nursing facility however this has not happened yet. Unfortunately she is too weak in her upper body to manage with crutches, tells me that she cannot navigate with a walker at home. As such, hospitalist service was asked to admit the patient to the hospital with anticipated surgical repair with Dr. Kit on 8/31.   Assessment & Plan:   Left ankle bimalleolar fracture Patient presenting to ED after mechanical fall with subsequent left ankle pain.  Imaging on arrival notable for mildly displaced fracture of distal left fibula, medial malleolus with the reduction of the ankle mortise. -- Orthopedics following, appreciate assistance -- NWB LLE -- Percocet 5-325 mg p.o. every 6 hours as needed moderate pain -- Dilaudid  0.5-1 mg IV every 2 hours as needed severe pain -- Robaxin  5 mg p.o. every 6 hours PRN muscle spasms -- Orthopedics anticipates surgery intervention on Sunday 08/15/2024; n.p.o. after midnight  Hypothyroidism -- Levothyroxine  50 mcg p.o. daily   DVT prophylaxis:     Code Status: Full Code Family Communication: No family present at bedside  Disposition Plan:  Level of care: Med-Surg Status is: Observation The patient remains OBS appropriate and will d/c before 2 midnights.    Consultants:  Orthopedics, Dr. Kit  Procedures:   None  Antimicrobials:  None   Subjective: Patient seen examined bedside, lying in bed.  Eating breakfast.  Pain remains well-controlled.  No complaints this morning.  Awaiting for operative management, plan tomorrow.  Denies headache, no dizziness, no chest pain, no shortness of breath, no abdominal pain, no fever/chills/night sweats, no nausea/vomiting/diarrhea.  No acute events overnight per nursing staff.  Objective: Vitals:   08/13/24 0906 08/13/24 1310 08/13/24 2128 08/14/24 0541  BP: 131/66 (!) 149/83 130/67 138/74  Pulse: 84 76 78 67  Resp: 16 16 16 16   Temp: 98.1 F (36.7 C) 97.8 F (36.6 C) 98.9 F (37.2 C) 97.8 F (36.6 C)  TempSrc: Oral Oral Oral Oral  SpO2: 95% 97% 96% 96%  Weight:      Height:        Intake/Output Summary (Last 24 hours) at 08/14/2024 0931 Last data filed at 08/14/2024 0918 Gross per 24 hour  Intake 1060 ml  Output 1445 ml  Net -385 ml   Filed Weights   08/11/24 1519  Weight: 87.1 kg    Examination:  Physical Exam: GEN: NAD, alert and oriented x 3, wd/wn HEENT: NCAT, PERRL, EOMI, sclera clear, MMM PULM: CTAB w/o wheezes/crackles, normal respiratory effort, room air CV: RRR w/o M/G/R GI: abd soft, NTND, NABS, no R/G/M MSK: no peripheral edema, left foot/ankle with splint/Ace wrap in place, neurovascularly intact NEURO: CN II-XII intact, no focal deficits, sensation to light touch intact PSYCH: normal mood/affect Integumentary: dry/intact, no rashes or wounds    Data Reviewed: I have personally reviewed following labs and imaging studies  CBC: Recent Labs  Lab 08/13/24 0317  WBC 6.9  HGB 12.5  HCT 38.7  MCV 89.6  PLT 264   Basic Metabolic Panel: Recent Labs  Lab 08/13/24 0317  NA 138  K 4.1  CL 104  CO2 23  GLUCOSE 113*  BUN 10  CREATININE 0.76  CALCIUM 9.1   GFR: Estimated Creatinine Clearance: 71.6 mL/min (by C-G formula based on SCr of 0.76 mg/dL). Liver Function Tests: No results for input(s): AST,  ALT, ALKPHOS, BILITOT, PROT, ALBUMIN in the last 168 hours. No results for input(s): LIPASE, AMYLASE in the last 168 hours. No results for input(s): AMMONIA in the last 168 hours. Coagulation Profile: No results for input(s): INR, PROTIME in the last 168 hours. Cardiac Enzymes: No results for input(s): CKTOTAL, CKMB, CKMBINDEX, TROPONINI in the last 168 hours. BNP (last 3 results) No results for input(s): PROBNP in the last 8760 hours. HbA1C: No results for input(s): HGBA1C in the last 72 hours. CBG: No results for input(s): GLUCAP in the last 168 hours. Lipid Profile: No results for input(s): CHOL, HDL, LDLCALC, TRIG, CHOLHDL, LDLDIRECT in the last 72 hours. Thyroid  Function Tests: No results for input(s): TSH, T4TOTAL, FREET4, T3FREE, THYROIDAB in the last 72 hours. Anemia Panel: No results for input(s): VITAMINB12, FOLATE, FERRITIN, TIBC, IRON, RETICCTPCT in the last 72 hours. Sepsis Labs: No results for input(s): PROCALCITON, LATICACIDVEN in the last 168 hours.  Recent Results (from the past 240 hours)  Surgical PCR screen     Status: None   Collection Time: 08/12/24  4:58 PM   Specimen: Nasal Mucosa; Nasal Swab  Result Value Ref Range Status   MRSA, PCR NEGATIVE NEGATIVE Final   Staphylococcus aureus NEGATIVE NEGATIVE Final    Comment: (NOTE) The Xpert SA Assay (FDA approved for NASAL specimens in patients 63 years of age and older), is one component of a comprehensive surveillance program. It is not intended to diagnose infection nor to guide or monitor treatment. Performed at [facility], [address] [place], [place] [address]          Radiology Studies: No results found.       Scheduled Meds:  cholecalciferol   5,000 Units Oral Q breakfast   [START ON 08/15/2024] feeding supplement  296 mL Oral Once   levothyroxine   50 mcg Oral QPM   Continuous Infusions:   [START ON 08/15/2024] sodium chloride        LOS: 1 day    Time spent: 46 minutes spent on 08/14/2024 caring for this patient face-to-face including chart review, ordering labs/tests, documenting, discussion with nursing staff, consultants, updating family and interview/physical exam    Camellia PARAS [place], DO [facility] Available via Epic secure chat 7am-7pm After these hours, please refer to coverage provider listed on [email or web address] 08/14/2024, 9:31 AM

## 2024-08-15 ENCOUNTER — Encounter (HOSPITAL_COMMUNITY): Payer: Self-pay | Admitting: Internal Medicine

## 2024-08-15 ENCOUNTER — Observation Stay (HOSPITAL_COMMUNITY): Admitting: Anesthesiology

## 2024-08-15 ENCOUNTER — Encounter (HOSPITAL_COMMUNITY)
Admission: EM | Disposition: A | Discharge status: Skilled Nursing Facility | Payer: Self-pay | Source: Home / Self Care | Attending: Internal Medicine

## 2024-08-15 ENCOUNTER — Other Ambulatory Visit: Payer: Self-pay

## 2024-08-15 DIAGNOSIS — Y9239 Other specified sports and athletic area as the place of occurrence of the external cause: Secondary | ICD-10-CM | POA: Diagnosis not present

## 2024-08-15 DIAGNOSIS — Z743 Need for continuous supervision: Secondary | ICD-10-CM | POA: Diagnosis not present

## 2024-08-15 DIAGNOSIS — Z79890 Hormone replacement therapy (postmenopausal): Secondary | ICD-10-CM | POA: Diagnosis not present

## 2024-08-15 DIAGNOSIS — M6281 Muscle weakness (generalized): Secondary | ICD-10-CM | POA: Diagnosis not present

## 2024-08-15 DIAGNOSIS — Z8249 Family history of ischemic heart disease and other diseases of the circulatory system: Secondary | ICD-10-CM | POA: Diagnosis not present

## 2024-08-15 DIAGNOSIS — S82843A Displaced bimalleolar fracture of unspecified lower leg, initial encounter for closed fracture: Secondary | ICD-10-CM | POA: Diagnosis not present

## 2024-08-15 DIAGNOSIS — Z7401 Bed confinement status: Secondary | ICD-10-CM | POA: Diagnosis not present

## 2024-08-15 DIAGNOSIS — Z9181 History of falling: Secondary | ICD-10-CM | POA: Diagnosis not present

## 2024-08-15 DIAGNOSIS — R1310 Dysphagia, unspecified: Secondary | ICD-10-CM | POA: Diagnosis not present

## 2024-08-15 DIAGNOSIS — S82852A Displaced trimalleolar fracture of left lower leg, initial encounter for closed fracture: Secondary | ICD-10-CM

## 2024-08-15 DIAGNOSIS — Z96641 Presence of right artificial hip joint: Secondary | ICD-10-CM | POA: Diagnosis present

## 2024-08-15 DIAGNOSIS — R6889 Other general symptoms and signs: Secondary | ICD-10-CM | POA: Diagnosis not present

## 2024-08-15 DIAGNOSIS — S99919A Unspecified injury of unspecified ankle, initial encounter: Secondary | ICD-10-CM | POA: Diagnosis not present

## 2024-08-15 DIAGNOSIS — W109XXA Fall (on) (from) unspecified stairs and steps, initial encounter: Secondary | ICD-10-CM | POA: Diagnosis present

## 2024-08-15 DIAGNOSIS — Z888 Allergy status to other drugs, medicaments and biological substances status: Secondary | ICD-10-CM | POA: Diagnosis not present

## 2024-08-15 DIAGNOSIS — S82842A Displaced bimalleolar fracture of left lower leg, initial encounter for closed fracture: Secondary | ICD-10-CM | POA: Diagnosis not present

## 2024-08-15 DIAGNOSIS — R2689 Other abnormalities of gait and mobility: Secondary | ICD-10-CM | POA: Diagnosis not present

## 2024-08-15 DIAGNOSIS — S82892A Other fracture of left lower leg, initial encounter for closed fracture: Secondary | ICD-10-CM | POA: Diagnosis not present

## 2024-08-15 DIAGNOSIS — S82899A Other fracture of unspecified lower leg, initial encounter for closed fracture: Secondary | ICD-10-CM | POA: Diagnosis present

## 2024-08-15 DIAGNOSIS — S82892D Other fracture of left lower leg, subsequent encounter for closed fracture with routine healing: Secondary | ICD-10-CM | POA: Diagnosis not present

## 2024-08-15 DIAGNOSIS — G8918 Other acute postprocedural pain: Secondary | ICD-10-CM | POA: Diagnosis not present

## 2024-08-15 DIAGNOSIS — E039 Hypothyroidism, unspecified: Secondary | ICD-10-CM | POA: Diagnosis not present

## 2024-08-15 HISTORY — PX: ORIF ANKLE FRACTURE: SHX5408

## 2024-08-15 LAB — CBC
HCT: 39.8 % (ref 36.0–46.0)
Hemoglobin: 12.5 g/dL (ref 12.0–15.0)
MCH: 27.8 pg (ref 26.0–34.0)
MCHC: 31.4 g/dL (ref 30.0–36.0)
MCV: 88.6 fL (ref 80.0–100.0)
Platelets: 293 K/uL (ref 150–400)
RBC: 4.49 MIL/uL (ref 3.87–5.11)
RDW: 14.6 % (ref 11.5–15.5)
WBC: 6.2 K/uL (ref 4.0–10.5)
nRBC: 0 % (ref 0.0–0.2)

## 2024-08-15 LAB — BASIC METABOLIC PANEL WITH GFR
Anion gap: 12 (ref 5–15)
BUN: 12 mg/dL (ref 8–23)
CO2: 22 mmol/L (ref 22–32)
Calcium: 9.2 mg/dL (ref 8.9–10.3)
Chloride: 102 mmol/L (ref 98–111)
Creatinine, Ser: 0.7 mg/dL (ref 0.44–1.00)
GFR, Estimated: >60 mL/min (ref >60)
Glucose, Bld: 116 mg/dL — ABNORMAL HIGH (ref 70–99)
Potassium: 4 mmol/L (ref 3.5–5.1)
Sodium: 136 mmol/L (ref 135–145)

## 2024-08-15 SURGERY — OPEN REDUCTION INTERNAL FIXATION (ORIF) ANKLE FRACTURE
Anesthesia: General | Site: Ankle | Laterality: Left

## 2024-08-15 MED ORDER — PROPOFOL 500 MG/50ML IV EMUL
INTRAVENOUS | Status: AC
  Filled 2024-08-15: qty 50

## 2024-08-15 MED ORDER — BACITRACIN ZINC 500 UNIT/GM EX OINT
TOPICAL_OINTMENT | CUTANEOUS | Status: AC
  Filled 2024-08-15: qty 56.7

## 2024-08-15 MED ORDER — CHLORHEXIDINE GLUCONATE 4 % EX SOLN: 60.0000 mL | Freq: Once | CUTANEOUS | Status: DC

## 2024-08-15 MED ORDER — BISACODYL 10 MG RE SUPP
10.0000 mg | Freq: Every day | RECTAL | Status: DC | PRN
  Filled 2024-08-15: qty 1

## 2024-08-15 MED ORDER — BUPIVACAINE-EPINEPHRINE (PF) 0.5% -1:200000 IJ SOLN
INTRAMUSCULAR | Status: DC | PRN
  Administered 2024-08-15 (×2): 15 mL via PERINEURAL

## 2024-08-15 MED ORDER — MORPHINE SULFATE (PF) 2 MG/ML IV SOLN: 0.5000 mg | INTRAVENOUS | Status: DC | PRN

## 2024-08-15 MED ORDER — FENTANYL CITRATE (PF) 100 MCG/2ML IJ SOLN
INTRAMUSCULAR | Status: DC | PRN
  Administered 2024-08-15 (×2): 50 ug via INTRAVENOUS

## 2024-08-15 MED ORDER — CEFAZOLIN SODIUM-DEXTROSE 2-4 GM/100ML-% IV SOLN
INTRAVENOUS | Status: AC
  Filled 2024-08-15: qty 100

## 2024-08-15 MED ORDER — ACETAMINOPHEN 500 MG PO TABS
1000.0000 mg | ORAL_TABLET | Freq: Once | ORAL | Status: AC
  Administered 2024-08-15: 1000 mg via ORAL

## 2024-08-15 MED ORDER — HYDROMORPHONE HCL 1 MG/ML IJ SOLN: 0.2500 mg | INTRAMUSCULAR | Status: DC | PRN

## 2024-08-15 MED ORDER — ONDANSETRON HCL 4 MG/2ML IJ SOLN
INTRAMUSCULAR | Status: DC | PRN
  Administered 2024-08-15: 4 mg via INTRAVENOUS

## 2024-08-15 MED ORDER — PROPOFOL 500 MG/50ML IV EMUL
INTRAVENOUS | Status: DC | PRN
  Administered 2024-08-15: 150 ug/kg/min via INTRAVENOUS

## 2024-08-15 MED ORDER — VANCOMYCIN HCL 1000 MG IV SOLR
INTRAVENOUS | Status: DC | PRN
  Administered 2024-08-15: 1000 mg via TOPICAL

## 2024-08-15 MED ORDER — ENOXAPARIN SODIUM 40 MG/0.4ML IJ SOSY
40.0000 mg | PREFILLED_SYRINGE | INTRAMUSCULAR | Status: DC
  Administered 2024-08-16 – 2024-08-18 (×3): 40 mg via SUBCUTANEOUS
  Filled 2024-08-15 (×3): qty 0.4

## 2024-08-15 MED ORDER — DROPERIDOL 2.5 MG/ML IJ SOLN: 0.6250 mg | Freq: Once | INTRAMUSCULAR | Status: DC | PRN

## 2024-08-15 MED ORDER — PROPOFOL 1000 MG/100ML IV EMUL
INTRAVENOUS | Status: AC
  Filled 2024-08-15: qty 100

## 2024-08-15 MED ORDER — HYDROCODONE-ACETAMINOPHEN 7.5-325 MG PO TABS: 1.0000 | ORAL_TABLET | ORAL | Status: DC | PRN

## 2024-08-15 MED ORDER — VANCOMYCIN HCL 1000 MG IV SOLR
INTRAVENOUS | Status: AC
  Filled 2024-08-15: qty 20

## 2024-08-15 MED ORDER — ACETAMINOPHEN 500 MG PO TABS
ORAL_TABLET | ORAL | Status: AC
  Filled 2024-08-15: qty 2

## 2024-08-15 MED ORDER — EPHEDRINE SULFATE (PRESSORS) 50 MG/ML IJ SOLN
INTRAMUSCULAR | Status: DC | PRN
  Administered 2024-08-15 (×3): 5 mg via INTRAVENOUS

## 2024-08-15 MED ORDER — DOCUSATE SODIUM 100 MG PO CAPS
100.0000 mg | ORAL_CAPSULE | Freq: Two times a day (BID) | ORAL | Status: DC
  Administered 2024-08-15 – 2024-08-17 (×4): 100 mg via ORAL
  Filled 2024-08-15 (×4): qty 1

## 2024-08-15 MED ORDER — METOCLOPRAMIDE HCL 5 MG/ML IJ SOLN: 5.0000 mg | Freq: Three times a day (TID) | INTRAMUSCULAR | Status: DC | PRN

## 2024-08-15 MED ORDER — PROPOFOL 10 MG/ML IV BOLUS
INTRAVENOUS | Status: AC
  Filled 2024-08-15: qty 20

## 2024-08-15 MED ORDER — PROPOFOL 1000 MG/100ML IV EMUL
INTRAVENOUS | Status: AC
  Filled 2024-08-15: qty 300

## 2024-08-15 MED ORDER — LIDOCAINE HCL (CARDIAC) PF 100 MG/5ML IV SOSY
PREFILLED_SYRINGE | INTRAVENOUS | Status: DC | PRN
  Administered 2024-08-15: 100 mg via INTRAVENOUS

## 2024-08-15 MED ORDER — CEFAZOLIN SODIUM-DEXTROSE 2-4 GM/100ML-% IV SOLN
2.0000 g | INTRAVENOUS | Status: AC
  Administered 2024-08-15: 2 g via INTRAVENOUS

## 2024-08-15 MED ORDER — SODIUM CHLORIDE 0.9 % IV SOLN: INTRAVENOUS | Status: DC

## 2024-08-15 MED ORDER — CELECOXIB 200 MG PO CAPS
200.0000 mg | ORAL_CAPSULE | Freq: Two times a day (BID) | ORAL | Status: DC
  Administered 2024-08-15 – 2024-08-18 (×7): 200 mg via ORAL
  Filled 2024-08-15 (×7): qty 1

## 2024-08-15 MED ORDER — DEXAMETHASONE SODIUM PHOSPHATE 4 MG/ML IJ SOLN
INTRAMUSCULAR | Status: DC | PRN
  Administered 2024-08-15: 5 mg via INTRAVENOUS

## 2024-08-15 MED ORDER — POLYETHYLENE GLYCOL 3350 17 G PO PACK: 17.0000 g | PACK | Freq: Every day | ORAL | Status: DC | PRN

## 2024-08-15 MED ORDER — SENNA 8.6 MG PO TABS
1.0000 | ORAL_TABLET | Freq: Two times a day (BID) | ORAL | Status: DC
  Administered 2024-08-15 – 2024-08-17 (×4): 8.6 mg via ORAL
  Filled 2024-08-15 (×4): qty 1

## 2024-08-15 MED ORDER — BUPIVACAINE LIPOSOME 1.3 % IJ SUSP
INTRAMUSCULAR | Status: DC | PRN
  Administered 2024-08-15: 10 mL via PERINEURAL

## 2024-08-15 MED ORDER — BUPIVACAINE LIPOSOME 1.3 % IJ SUSP
INTRAMUSCULAR | Status: AC
  Filled 2024-08-15: qty 20

## 2024-08-15 MED ORDER — 0.9 % SODIUM CHLORIDE (POUR BTL) OPTIME
TOPICAL | Status: DC | PRN
  Administered 2024-08-15: 1000 mL

## 2024-08-15 MED ORDER — LEVOTHYROXINE SODIUM 50 MCG PO TABS
50.0000 ug | ORAL_TABLET | Freq: Every day | ORAL | Status: DC
  Administered 2024-08-15 – 2024-08-17 (×3): 50 ug via ORAL
  Filled 2024-08-15 (×3): qty 1

## 2024-08-15 MED ORDER — MAGNESIUM CITRATE PO SOLN: 1.0000 | Freq: Once | ORAL | Status: DC | PRN

## 2024-08-15 MED ORDER — METOCLOPRAMIDE HCL 5 MG PO TABS: 5.0000 mg | ORAL_TABLET | Freq: Three times a day (TID) | ORAL | Status: DC | PRN

## 2024-08-15 MED ORDER — PROPOFOL 10 MG/ML IV BOLUS
INTRAVENOUS | Status: DC | PRN
  Administered 2024-08-15: 150 mg via INTRAVENOUS
  Administered 2024-08-15: 50 mg via INTRAVENOUS

## 2024-08-15 MED ORDER — FENTANYL CITRATE (PF) 100 MCG/2ML IJ SOLN
INTRAMUSCULAR | Status: AC
  Filled 2024-08-15: qty 2

## 2024-08-15 MED ORDER — CEFAZOLIN SODIUM-DEXTROSE 2-4 GM/100ML-% IV SOLN
2.0000 g | Freq: Four times a day (QID) | INTRAVENOUS | Status: AC
  Administered 2024-08-15 – 2024-08-16 (×2): 2 g via INTRAVENOUS
  Filled 2024-08-15 (×2): qty 100

## 2024-08-15 MED ORDER — POVIDONE-IODINE 10 % EX SWAB: 2.0000 | Freq: Once | CUTANEOUS | Status: DC

## 2024-08-15 MED ORDER — HYDROCODONE-ACETAMINOPHEN 5-325 MG PO TABS
1.0000 | ORAL_TABLET | ORAL | Status: DC | PRN
  Administered 2024-08-16: 1 via ORAL
  Filled 2024-08-15: qty 1

## 2024-08-15 SURGICAL SUPPLY — 64 items
ANCHOR SUT KEITH ABD SZ2 STR (SUTURE) ×2 IMPLANT
BAG COUNTER SPONGE SURGICOUNT (BAG) IMPLANT
BAG ZIPLOCK 12X15 (MISCELLANEOUS) ×2 IMPLANT
BIT DRILL SHORT 2.0 ZI (BIT) IMPLANT
BIT DRILL SHORT 2.5 (BIT) IMPLANT
BNDG COHESIVE 4X5 TAN STRL LF (GAUZE/BANDAGES/DRESSINGS) ×2 IMPLANT
BNDG COHESIVE 6X5 TAN ST LF (GAUZE/BANDAGES/DRESSINGS) ×2 IMPLANT
BNDG ELASTIC 4INX 5YD STR LF (GAUZE/BANDAGES/DRESSINGS) ×2 IMPLANT
BNDG ELASTIC 6INX 5YD STR LF (GAUZE/BANDAGES/DRESSINGS) ×2 IMPLANT
BNDG GAUZE DERMACEA FLUFF 4 (GAUZE/BANDAGES/DRESSINGS) ×4 IMPLANT
COVER SURGICAL LIGHT HANDLE (MISCELLANEOUS) ×2 IMPLANT
CUFF TRNQT CYL 34X4.125X (TOURNIQUET CUFF) ×2 IMPLANT
DRAPE C-ARM 42X120 X-RAY (DRAPES) ×2 IMPLANT
DRAPE EXTREMITY T 121X128X90 (DISPOSABLE) ×2 IMPLANT
DRAPE OEC MINIVIEW 54X84 (DRAPES) ×2 IMPLANT
DRAPE SURG ORHT 6 SPLT 77X108 (DRAPES) ×4 IMPLANT
DRAPE U-SHAPE 47X51 STRL (DRAPES) ×2 IMPLANT
DRIVER RETENTION T15 LONG (ORTHOPEDIC DISPOSABLE SUPPLIES) IMPLANT
DRSG ADAPTIC 3X8 NADH LF (GAUZE/BANDAGES/DRESSINGS) ×2 IMPLANT
DRSG MEPITEL 8X12 (GAUZE/BANDAGES/DRESSINGS) IMPLANT
DURAPREP 26ML APPLICATOR (WOUND CARE) ×2 IMPLANT
ELECT REM PT RETURN 15FT ADLT (MISCELLANEOUS) ×2 IMPLANT
FACESHIELD WRAPAROUND OR TEAM (MASK) ×2 IMPLANT
GAUZE PAD ABD 8X10 STRL (GAUZE/BANDAGES/DRESSINGS) ×4 IMPLANT
GAUZE SPONGE 4X4 12PLY STRL (GAUZE/BANDAGES/DRESSINGS) ×2 IMPLANT
GLOVE BIO SURGEON STRL SZ8 (GLOVE) ×2 IMPLANT
GLOVE BIOGEL PI IND STRL 8 (GLOVE) ×2 IMPLANT
GLOVE SS BIOGEL STRL SZ 7.5 (GLOVE) ×2 IMPLANT
GLOVE SURG ORTHO 8.5 STRL (GLOVE) ×2 IMPLANT
KIT BASIN OR (CUSTOM PROCEDURE TRAY) ×2 IMPLANT
KIT TURNOVER KIT A (KITS) ×2 IMPLANT
KWIRE ALPS MXV 1.6X6 ZI (WIRE) IMPLANT
KWIRE TROC 1.25X150 (WIRE) IMPLANT
NDL HYPO 22X1.5 SAFETY MO (MISCELLANEOUS) ×2 IMPLANT
NDL MAYO CATGUT SZ4 TPR NDL (NEEDLE) ×2 IMPLANT
NEEDLE HYPO 22X1.5 SAFETY MO (MISCELLANEOUS) ×1 IMPLANT
NEEDLE MAYO CATGUT SZ4 (NEEDLE) ×1 IMPLANT
NS IRRIG 1000ML POUR BTL (IV SOLUTION) ×2 IMPLANT
PACK TOTAL JOINT (CUSTOM PROCEDURE TRAY) ×2 IMPLANT
PAD CAST 4YDX4 CTTN HI CHSV (CAST SUPPLIES) ×4 IMPLANT
PADDING CAST COTTON 6X4 STRL (CAST SUPPLIES) ×2 IMPLANT
PLATE LAT FIB 4H LT (Plate) IMPLANT
PROTECTOR NERVE ULNAR (MISCELLANEOUS) ×2 IMPLANT
SCREW CANN PT 4X40 NS (Screw) IMPLANT
SCREW CANNULATED PT 4.0X40 (Screw) ×2 IMPLANT
SCREW LOCK MDS 2.7X12 (Screw) IMPLANT
SCREW LOCK MDS 2.7X14 (Screw) IMPLANT
SCREW LOCK MDS 2.7X18 (Screw) IMPLANT
SCREW LOCK MDS 3.5X14 (Screw) IMPLANT
SCREW NLOCK ALPS 3.5X14 (Screw) IMPLANT
SCREW NLOCK ALPS 3.5X18 (Screw) IMPLANT
SPIKE FLUID TRANSFER (MISCELLANEOUS) ×2 IMPLANT
SPONGE T-LAP 4X18 ~~LOC~~+RFID (SPONGE) ×4 IMPLANT
STAPLER SKIN PROX 35W (STAPLE) ×2 IMPLANT
STRAP ANKLE DISTRACTOR (MISCELLANEOUS) ×2 IMPLANT
STRIP CLOSURE SKIN 1/2X4 (GAUZE/BANDAGES/DRESSINGS) ×2 IMPLANT
SUCTION TUBE FRAZIER 10FR DISP (SUCTIONS) ×2 IMPLANT
SUT MNCRL AB 4-0 PS2 18 (SUTURE) ×4 IMPLANT
SUT PROLENE 3 0 PS 2 (SUTURE) ×2 IMPLANT
SUT VIC AB 0 CT1 36 (SUTURE) ×2 IMPLANT
SUT VIC AB 2-0 CT1 TAPERPNT 27 (SUTURE) ×2 IMPLANT
SUT VIC AB 3-0 PS2 18XBRD (SUTURE) ×2 IMPLANT
SYR CONTROL 10ML LL (SYRINGE) ×2 IMPLANT
WATER STERILE IRR 1000ML POUR (IV SOLUTION) ×2 IMPLANT

## 2024-08-15 NOTE — Anesthesia Procedure Notes (Addendum)
 Anesthesia Regional Block: Popliteal block   Pre-Anesthetic Checklist: , timeout performed,  Correct Patient, Correct Site, Correct Laterality,  Correct Procedure, Correct Position, site marked,  Risks and benefits discussed,  Pre-op evaluation,  At surgeon's request and post-op pain management  Laterality: Left  Prep: Maximum Sterile Barrier Precautions used, chloraprep       Needles:  Injection technique: Single-shot  Needle Type: Echogenic Stimulator Needle     Needle Length: 9cm  Needle Gauge: 22     Additional Needles:   Procedures:,,,, ultrasound used (permanent image in chart),,    Narrative:  Start time: 08/15/2024 7:36 AM End time: 08/15/2024 7:39 AM Injection made incrementally with aspirations every 5 mL.  Performed by: Personally  Anesthesiologist: Paul Lamarr BRAVO, MD  Additional Notes: Risks, benefits, and alternative discussed. Patient gave consent for procedure. Patient prepped and draped in sterile fashion. Sedation administered, patient remains easily responsive to voice. Relevant anatomy identified with ultrasound guidance. Local anesthetic given in 5cc increments with no signs or symptoms of intravascular injection. No pain or paraesthesias with injection. Patient monitored throughout procedure with signs of LAST or immediate complications. Tolerated well. Ultrasound image placed in chart.  LANEY Paul, MD

## 2024-08-15 NOTE — Anesthesia Postprocedure Evaluation (Signed)
 Anesthesia Post Note  Patient: Lisa Oliver  Procedure(s) Performed: OPEN REDUCTION INTERNAL FIXATION (ORIF) ANKLE FRACTURE (Left: Ankle)     Patient location during evaluation: PACU Anesthesia Type: General Level of consciousness: awake and alert Pain management: pain level controlled Vital Signs Assessment: post-procedure vital signs reviewed and stable Respiratory status: spontaneous breathing, nonlabored ventilation and respiratory function stable Cardiovascular status: blood pressure returned to baseline Postop Assessment: no apparent nausea or vomiting Anesthetic complications: no   No notable events documented.  Last Vitals:  Vitals:   08/15/24 0915 08/15/24 0930  BP: 120/65   Pulse: 76 74  Resp: 12 15  Temp:    SpO2: 99% 100%    Last Pain:  Vitals:   08/15/24 0930  TempSrc:   PainSc: 0-No pain    LLE Motor Response: No movement due to regional block (08/15/24 0930) LLE Sensation: Numbness (08/15/24 0930) RLE Motor Response: Purposeful movement;Responds to commands (08/15/24 0930) RLE Sensation: Full sensation (08/15/24 0930)      Vertell Row

## 2024-08-15 NOTE — Transfer of Care (Signed)
 Immediate Anesthesia Transfer of Care Note  Patient: Lisa Oliver  Procedure(s) Performed: Procedure(s): OPEN REDUCTION INTERNAL FIXATION (ORIF) ANKLE FRACTURE (Left)  Patient Location: PACU  Anesthesia Type:General  Level of Consciousness:  sedated, patient cooperative and responds to stimulation  Airway & Oxygen Therapy:Patient Spontanous Breathing and Patient connected to face mask oxgen  Post-op Assessment:  Report given to PACU RN and Post -op Vital signs reviewed and stable  Post vital signs:  Reviewed and stable  Last Vitals:  Vitals:   08/15/24 0443 08/15/24 0701  BP: (!) 142/79 (!) 162/81  Pulse: 72 70  Resp: 16 13  Temp: 36.5 C 36.6 C  SpO2: 96% 98%    Complications: No apparent anesthesia complications

## 2024-08-15 NOTE — Anesthesia Procedure Notes (Signed)
 Anesthesia Regional Block: Adductor canal block   Pre-Anesthetic Checklist: , timeout performed,  Correct Patient, Correct Site, Correct Laterality,  Correct Procedure, Correct Position, site marked,  Risks and benefits discussed,  Pre-op evaluation,  At surgeon's request and post-op pain management  Laterality: Left  Prep: Maximum Sterile Barrier Precautions used, chloraprep       Needles:  Injection technique: Single-shot  Needle Type: Echogenic Stimulator Needle     Needle Length: 9cm  Needle Gauge: 22     Additional Needles:   Procedures:,,,, ultrasound used (permanent image in chart),,    Narrative:  Start time: 08/15/2024 7:33 AM End time: 08/15/2024 7:36 AM Injection made incrementally with aspirations every 5 mL.  Performed by: Personally  Anesthesiologist: Paul Lamarr BRAVO, MD  Additional Notes: Risks, benefits, and alternative discussed. Patient gave consent for procedure. Patient prepped and draped in sterile fashion. Sedation administered, patient remains easily responsive to voice. Relevant anatomy identified with ultrasound guidance. Local anesthetic given in 5cc increments with no signs or symptoms of intravascular injection. No pain or paraesthesias with injection. Patient monitored throughout procedure with signs of LAST or immediate complications. Tolerated well. Ultrasound image placed in chart.  LANEY Paul, MD

## 2024-08-15 NOTE — Op Note (Signed)
 08/11/2024 - 08/15/2024  7:29 AM  PATIENT:  Lisa Oliver  73 y.o. female  PRE-OPERATIVE DIAGNOSIS:  LEFT BIMALLEOLAR ANKLE FRACTURE  POST-OPERATIVE DIAGNOSIS: Left ankle trimalleolar fracture  Procedure(s): 1.  Open treatment left ankle trimalleolar fracture with internal fixation without fixation of the posterior malleolus   2.  Stress exam of left ankle under fluoro   3.  AP, mortise and lateral xrays of the left ankle  SURGEON:  [name], MD  ASSISTANT: none  ANESTHESIA:   General, regional  EBL:  minimal   TOURNIQUET:    COMPLICATIONS:  None apparent  DISPOSITION:  Extubated, awake and stable to recovery.  INDICATION FOR PROCEDURE:  73 y/o female without significant pmh injured her left ankle a few days ago.  She has a displaced bimalleolar fracture and presents today for open treatment with internal fixation.  The risks and benefits of the alternative treatment options have been discussed in detail.  The patient wishes to proceed with surgery and specifically understands risks of bleeding, infection, nerve damage, blood clots, need for additional surgery, amputation and death.   PROCEDURE IN DETAIL:  After pre operative consent was obtained, and the correct operative site was identified, the patient was brought to the operating room and placed supine on the OR table.  Anesthesia was administered.  Pre-operative antibiotics were administered.  A surgical timeout was taken.  The left lower extremity was prepped and draped in standard sterile fashion with a tourniquet around the thigh.  The extremity was elevated, and the tourniquet was inflated to 250 mmHg.  A longitudinal incision was made over the lateral malleolus.  Dissection was carried sharply down through the subcutaneous tissues.  The fracture was cleaned of all hematoma and bone fragments.  There was significant comminution noted.  The fracture was reduced and provisionally held with a lobster claw and a pointed  tenaculum.  Radiographs confirmed restoration of the length of the fibula and appropriate alignment.  A 4-hole anatomic [employer] locking small frag plate was selected.  It was provisionally pinned.  Radiographs confirmed appropriate position of the plate.  It was then secured distally with 4 locking screws and proximally with 2 nonlocking screws on the locking screw.  AP and lateral radiographs confirmed appropriate reduction of the lateral malleolus fracture in appropriate position and length of all hardware.  The wound was irrigated copiously and sprinkled with vancomycin  powder.  It was closed with 2-0 Vicryl and 3-0 nylon.  Attention was turned to the medial side of the ankle.  A longitudinal incision was made over the medial malleolus taking care to intersect a previous scar at a 90 degree angle.  Dissection was carried sharply down through the subcutaneous tissues.  The fracture was opened and cleaned of all hematoma and periosteum.  The fracture was reduced and held with a pointed tenaculum.  K wires were inserted for the 4 mm [employer] cannulated screws.  Radiographs confirmed appropriate reduction of the medial malleolus.  The K wires were used to insert 4 mm x 40 mm partially-threaded screws.  Both were noted to have excellent purchase and compressed the fracture site appropriately.  AP, mortise and lateral radiographs confirmed appropriate reduction of the medial and lateral malleolus fractures in appropriate position and length of all hardware.  The lateral view revealed a small posterior malleolus fracture that was appropriately reduced.  There was no indication for fixation of that fracture.  Stress examination was then performed.  Under live fluoroscopy with a  mortise view dorsiflexion and external rotation stress was applied to the supinated forefoot.  There was no instability noted at the syndesmosis.  The medial incision irrigated copiously and sprinkled with vancomycin  powder.   Subcutaneous tissues were approximated with Vicryl.  Skin incision was closed with nylon.  Sterile dressings were applied followed by a well-padded short leg splint.  The tourniquet was released after application of the dressings.  The patient was awakened from anesthesia and transported to the recovery room in stable condition.   FOLLOW UP PLAN:  nonweight bearing on the left lower extremity.  Admission back to the hospitalist service for PT and discharge planning.  Xarelto  for DVT prophylaxis for 2 weeks post op.   RADIOGRAPHS: AP, mortise and lateral radiographs of the left ankle are obtained intraoperatively.  These show interval reduction and fixation of the trimalleolar ankle fracture.  Hardware is appropriately positioned and of the appropriate lengths.  No other acute injuries are noted.

## 2024-08-15 NOTE — Progress Notes (Signed)
 Paged Emerge related to adding post operative antibiotics.

## 2024-08-15 NOTE — Plan of Care (Signed)

## 2024-08-15 NOTE — Progress Notes (Signed)
 PROGRESS NOTE    BRANDILEE PIES  FMW:[id number] DOB: 1951/04/23 DOA: 08/11/2024 PCP: Seabron Lenis, MD    Brief Narrative:   Lisa Oliver is a 73 y.o. female with past medical history significant for hypothyroidism who presented to [facility] ED on 08/11/2024 after sustaining a mechanical fall while leaving the gym with left ankle pain.  Workup in the ED with imaging findings consistent with mildly displaced fracture distal left fibula, medial malleolus with disruption of ankle mortise. She has been in the ER since yesterday, social work has been involved and she has also been seen by PT/OT, they were hoping to get her placed at a subacute nursing facility however this has not happened yet. Unfortunately she is too weak in her upper body to manage with crutches, tells me that she cannot navigate with a walker at home. As such, hospitalist service was asked to admit the patient to the hospital with anticipated surgical repair with Dr. Kit on 8/31.   Assessment & Plan:   Left ankle bimalleolar fracture Patient presenting to ED after mechanical fall with subsequent left ankle pain.  Imaging on arrival notable for mildly displaced fracture of distal left fibula, medial malleolus with the reduction of the ankle mortise.  Orthopedics was consulted and patient underwent open treatment left trimalleolar fracture with internal fixation. -- Orthopedics following, appreciate assistance -- NWB LLE -- Xarelto  for DVT prophylaxis postoperatively x 2 weeks per orthopedics -- Percocet 5-325 mg p.o. every 6 hours as needed moderate pain -- Dilaudid  0.5-1 mg IV every 2 hours as needed severe pain -- Robaxin  5 mg p.o. every 6 hours PRN muscle spasms -- PT/OT evaluation, anticipate need of SNF placement, TOC consult  Hypothyroidism -- Levothyroxine  50 mcg p.o. daily   DVT prophylaxis: enoxaparin  (LOVENOX ) injection 40 mg Start: 08/16/24 0800 SCDs Start: 08/15/24 1018    Code Status: Full Code Family  Communication: No family present at bedside  Disposition Plan:  Level of care: Med-Surg Status is: Observation The patient remains OBS appropriate and will d/c before 2 midnights.    Consultants:  Orthopedics, Dr. Kit  Procedures:  None  Antimicrobials:  None   Subjective: Patient seen examined bedside, lying in bed.  Just returned from PACU following operative management of her ankle fracture.  Pain controlled.  No complaints, questions or concerns.    Denies headache, no dizziness, no chest pain, no shortness of breath, no abdominal pain, no fever/chills/night sweats, no nausea/vomiting/diarrhea.  No acute events overnight per nursing staff.  Objective: Vitals:   08/15/24 0930 08/15/24 0945 08/15/24 1010 08/15/24 1201  BP:  131/69 137/70 138/73  Pulse: 74 65 66 68  Resp: 15 12 16 17   Temp:   (!) 97.5 F (36.4 C) 97.6 F (36.4 C)  TempSrc:    Oral  SpO2: 100% 99% 94% 99%  Weight:      Height:        Intake/Output Summary (Last 24 hours) at 08/15/2024 1302 Last data filed at 08/15/2024 1100 Gross per 24 hour  Intake 580 ml  Output 1200 ml  Net -620 ml   Filed Weights   08/11/24 1519  Weight: 87.1 kg    Examination:  Physical Exam: GEN: NAD, alert and oriented x 3, wd/wn HEENT: NCAT, PERRL, EOMI, sclera clear, MMM PULM: CTAB w/o wheezes/crackles, normal respiratory effort, room air CV: RRR w/o M/G/R GI: abd soft, NTND, NABS, no R/G/M MSK: no peripheral edema, left foot/ankle with splint/Ace wrap in place, nerve block still intact  NEURO: CN II-XII intact, no focal deficits PSYCH: normal mood/affect Integumentary: dry/intact, no rashes or wounds    Data Reviewed: I have personally reviewed following labs and imaging studies  CBC: Recent Labs  Lab 08/13/24 0317 08/15/24 0402  WBC 6.9 6.2  HGB 12.5 12.5  HCT 38.7 39.8  MCV 89.6 88.6  PLT 264 293   Basic Metabolic Panel: Recent Labs  Lab 08/13/24 0317 08/15/24 0402  NA 138 136  K 4.1 4.0   CL 104 102  CO2 23 22  GLUCOSE 113* 116*  BUN 10 12  CREATININE 0.76 0.70  CALCIUM 9.1 9.2   GFR: Estimated Creatinine Clearance: 71.6 mL/min (by C-G formula based on SCr of 0.7 mg/dL). Liver Function Tests: No results for input(s): AST, ALT, ALKPHOS, BILITOT, PROT, ALBUMIN in the last 168 hours. No results for input(s): LIPASE, AMYLASE in the last 168 hours. No results for input(s): AMMONIA in the last 168 hours. Coagulation Profile: No results for input(s): INR, PROTIME in the last 168 hours. Cardiac Enzymes: No results for input(s): CKTOTAL, CKMB, CKMBINDEX, TROPONINI in the last 168 hours. BNP (last 3 results) No results for input(s): PROBNP in the last 8760 hours. HbA1C: No results for input(s): HGBA1C in the last 72 hours. CBG: No results for input(s): GLUCAP in the last 168 hours. Lipid Profile: No results for input(s): CHOL, HDL, LDLCALC, TRIG, CHOLHDL, LDLDIRECT in the last 72 hours. Thyroid  Function Tests: No results for input(s): TSH, T4TOTAL, FREET4, T3FREE, THYROIDAB in the last 72 hours. Anemia Panel: No results for input(s): VITAMINB12, FOLATE, FERRITIN, TIBC, IRON, RETICCTPCT in the last 72 hours. Sepsis Labs: No results for input(s): PROCALCITON, LATICACIDVEN in the last 168 hours.  Recent Results (from the past 240 hours)  Surgical PCR screen     Status: None   Collection Time: 08/12/24  4:58 PM   Specimen: Nasal Mucosa; Nasal Swab  Result Value Ref Range Status   MRSA, PCR NEGATIVE NEGATIVE Final   Staphylococcus aureus NEGATIVE NEGATIVE Final    Comment: (NOTE) The Xpert SA Assay (FDA approved for NASAL specimens in patients 82 years of age and older), is one component of a comprehensive surveillance program. It is not intended to diagnose infection nor to guide or monitor treatment. Performed at [facility], [address] [place], [place] [address]    MRSA Next Gen by PCR, Nasal     Status: None   Collection Time: 08/14/24 10:06 AM   Specimen: Nasal Mucosa; Nasal Swab  Result Value Ref Range Status   MRSA by PCR Next Gen NOT DETECTED NOT DETECTED Final    Comment: (NOTE) The GeneXpert MRSA Assay (FDA approved for NASAL specimens only), is one component of a comprehensive MRSA colonization surveillance program. It is not intended to diagnose MRSA infection nor to guide or monitor treatment for MRSA infections. Test performance is not FDA approved in patients less than 69 years old. Performed at [facility], [address] [place], [place] [address]          Radiology Studies: No results found.       Scheduled Meds:  celecoxib   200 mg Oral BID   cholecalciferol   5,000 Units Oral Q breakfast   docusate sodium   100 mg Oral BID   [START ON 08/16/2024] enoxaparin  (LOVENOX ) injection  40 mg Subcutaneous Q24H   feeding supplement  296 mL Oral Once   levothyroxine   50 mcg Oral QPM   senna  1 tablet Oral BID  Continuous Infusions:  sodium chloride  50 mL/hr at 08/15/24 1030     LOS: 1 day    Time spent: 46 minutes spent on 08/15/2024 caring for this patient face-to-face including chart review, ordering labs/tests, documenting, discussion with nursing staff, consultants, updating family and interview/physical exam    Camellia PARAS [place], DO [facility] Available via Epic secure chat 7am-7pm After these hours, please refer to coverage provider listed on [email or web address] 08/15/2024, 1:02 PM

## 2024-08-16 DIAGNOSIS — S82892A Other fracture of left lower leg, initial encounter for closed fracture: Secondary | ICD-10-CM | POA: Diagnosis not present

## 2024-08-16 MED ORDER — SODIUM CHLORIDE 0.9 % IV BOLUS
500.0000 mL | Freq: Once | INTRAVENOUS | Status: AC
  Administered 2024-08-16: 500 mL via INTRAVENOUS

## 2024-08-16 MED ORDER — SODIUM CHLORIDE 0.9 % IV BOLUS: 500.0000 mL | Freq: Once | INTRAVENOUS | Status: DC

## 2024-08-16 NOTE — Progress Notes (Signed)
 Patient ID: Lisa Oliver, female   DOB: 1951-10-23, 73 y.o.   MRN: [id number] Subjective: 1 Day Post-Op Procedure(s) (LRB): OPEN REDUCTION INTERNAL FIXATION (ORIF) ANKLE FRACTURE (Left)    Patient reports pain as mild. No events over night  Objective:   VITALS:   Vitals:   08/16/24 0153 08/16/24 0424  BP: (!) 142/83 (!) 150/88  Pulse: 82 68  Resp: 18 18  Temp: 98 F (36.7 C) 98.1 F (36.7 C)  SpO2: 98% 97%    Exam: LLE short leg splint intact Intact sensibility Limited toe mobility  LABS Recent Labs    08/15/24 0402  HGB 12.5  HCT 39.8  WBC 6.2  PLT 293    Recent Labs    08/15/24 0402  NA 136  K 4.0  BUN 12  CREATININE 0.70  GLUCOSE 116*    No results for input(s): LABPT, INR in the last 72 hours.   Assessment/Plan: 1 Day Post-Op Procedure(s) (LRB): OPEN REDUCTION INTERNAL FIXATION (ORIF) ANKLE FRACTURE (Left)   Advance diet Up with therapy - NWB LLE Work with PT for safe home discharge Lives in ranch style home with few steps, but has dogs and cats RTC in 2 weeks to see [facility] Any ankle related questions, address to Eva Barrack, PA-C for [employer]

## 2024-08-16 NOTE — Plan of Care (Signed)
  Problem: Education: Goal: Knowledge of General Education information will improve Description: Including pain rating scale, medication(s)/side effects and non-pharmacologic comfort measures Outcome: Progressing   Problem: Health Behavior/Discharge Planning: Goal: Ability to manage health-related needs will improve Outcome: Progressing   Problem: Clinical Measurements: Goal: Ability to maintain clinical measurements within normal limits will improve Outcome: Progressing Goal: Will remain free from infection Outcome: Progressing Goal: Diagnostic test results will improve Outcome: Progressing Goal: Respiratory complications will improve Outcome: Progressing Goal: Cardiovascular complication will be avoided Outcome: Progressing   Problem: Activity: Goal: Risk for activity intolerance will decrease Outcome: Progressing   Problem: Nutrition: Goal: Adequate nutrition will be maintained Outcome: Progressing   Problem: Coping: Goal: Level of anxiety will decrease Outcome: Progressing   Problem: Elimination: Goal: Will not experience complications related to bowel motility Outcome: Progressing Goal: Will not experience complications related to urinary retention Outcome: Progressing   Problem: Pain Managment: Goal: General experience of comfort will improve and/or be controlled Outcome: Progressing   Problem: Safety: Goal: Ability to remain free from injury will improve Outcome: Progressing   Problem: Skin Integrity: Goal: Risk for impaired skin integrity will decrease Outcome: Progressing   Problem: Activity: Goal: Ability to avoid complications of mobility impairment will improve Outcome: Progressing Goal: Ability to tolerate increased activity will improve Outcome: Progressing   Problem: Education: Goal: Verbalization of understanding the information provided will improve Outcome: Progressing   Problem: Coping: Goal: Level of anxiety will decrease Outcome:  Progressing   Problem: Physical Regulation: Goal: Postoperative complications will be avoided or minimized Outcome: Progressing   Problem: Respiratory: Goal: Ability to maintain a clear airway will improve Outcome: Progressing   Problem: Pain Management: Goal: Pain level will decrease Outcome: Progressing   Problem: Skin Integrity: Goal: Signs of wound healing will improve Outcome: Progressing   Problem: Tissue Perfusion: Goal: Ability to maintain adequate tissue perfusion will improve Outcome: Progressing

## 2024-08-16 NOTE — Plan of Care (Signed)
   Problem: Activity: Goal: Risk for activity intolerance will decrease Outcome: Progressing   Problem: Pain Managment: Goal: General experience of comfort will improve and/or be controlled Outcome: Progressing   Problem: Safety: Goal: Ability to remain free from injury will improve Outcome: Progressing

## 2024-08-16 NOTE — Progress Notes (Signed)
 Pt called out saying that she is sitting on side of bed, and that she needs help getting back in the bed. When this RN entered the room, pt reports feeling dizzy and nauseas. Pts BP was 68/47. Pt placed in [place] position, and [employer], [place] called and fluid bolus order placed. Pt reports symptoms are resolving and BP rechecked and is 112/57. Pts BP rechecked again and is 124/60, and pt reports that she feels better. Pts bed returned to supine position, with her head elevated.

## 2024-08-16 NOTE — Progress Notes (Signed)
 PROGRESS NOTE    Lisa Oliver  FMW:[id number] DOB: [birth year] DOA: 08/11/2024 PCP: Seabron Lenis, MD    Brief Narrative:   Lisa Oliver is a 73 y.o. female with past medical history significant for hypothyroidism who presented to [facility] ED on 08/11/2024 after sustaining a mechanical fall while leaving the gym with left ankle pain.  Workup in the ED with imaging findings consistent with mildly displaced fracture distal left fibula, medial malleolus with disruption of ankle mortise. She has been in the ER since yesterday, social work has been involved and she has also been seen by PT/OT, they were hoping to get her placed at a subacute nursing facility however this has not happened yet. Unfortunately she is too weak in her upper body to manage with crutches, tells me that she cannot navigate with a walker at home. As such, hospitalist service was asked to admit the patient to the hospital with anticipated surgical repair with Dr. Kit on 8/31.   Assessment & Plan:   Left ankle bimalleolar fracture Patient presenting to ED after mechanical fall with subsequent left ankle pain.  Imaging on arrival notable for mildly displaced fracture of distal left fibula, medial malleolus with the reduction of the ankle mortise.  Orthopedics was consulted and patient underwent open treatment left trimalleolar fracture with internal fixation. -- Orthopedics following, appreciate assistance -- NWB LLE -- Xarelto  for DVT prophylaxis postoperatively x 2 weeks per orthopedics -- Percocet 5-325 mg p.o. every 6 hours as needed moderate pain -- Dilaudid  0.5-1 mg IV every 2 hours as needed severe pain -- Robaxin  5 mg p.o. every 6 hours PRN muscle spasms -- PT/OT evaluation, anticipate need of SNF placement, TOC consult  Hypothyroidism -- Levothyroxine  50 mcg p.o. daily   DVT prophylaxis: enoxaparin  (LOVENOX ) injection 40 mg Start: 08/16/24 0800 SCDs Start: 08/15/24 1018    Code Status: Full Code Family  Communication: No family present at bedside  Disposition Plan:  Level of care: Med-Surg Status is: Observation The patient remains OBS appropriate and will d/c before 2 midnights.    Consultants:  Orthopedics, Dr. Kit  Procedures:  None  Antimicrobials:  None   Subjective: Patient seen examined bedside, lying in bed.  Eating breakfast.  No specific complaints this morning.  Pain controlled.  Later in the morning while patient sitting at bedside, developed dizziness and hypotension, given 500 mL IVF bolus with resolution of symptoms and normalization of BP.  Awaiting to work with therapy postoperatively.  Hopeful that she can discharge home with home health.  Denies headache, no chest pain, no shortness of breath, no abdominal pain, no fever/chills/night sweats, no nausea/vomiting/diarrhea.  No acute events overnight per nursing staff.  Objective: Vitals:   08/16/24 1000 08/16/24 1005 08/16/24 1007 08/16/24 1021  BP: (!) 68/47 (!) 103/55 (!) 112/57 124/60  Pulse:  63 66   Resp:  16 16   Temp:      TempSrc:      SpO2:  96% 96%   Weight:      Height:        Intake/Output Summary (Last 24 hours) at 08/16/2024 1046 Last data filed at 08/16/2024 0935 Gross per 24 hour  Intake 2012.5 ml  Output 2800 ml  Net -787.5 ml   Filed Weights   08/11/24 1519  Weight: 87.1 kg    Examination:  Physical Exam: GEN: NAD, alert and oriented x 3, wd/wn HEENT: NCAT, PERRL, EOMI, sclera clear, MMM PULM: CTAB w/o wheezes/crackles, normal respiratory effort, room  air CV: RRR w/o M/G/R GI: abd soft, NTND, NABS, no R/G/M MSK: no peripheral edema, left foot/ankle with splint/Ace wrap in place, neurovascularly intact NEURO: CN II-XII intact, no focal deficits PSYCH: normal mood/affect Integumentary: dry/intact, no rashes or wounds    Data Reviewed: I have personally reviewed following labs and imaging studies  CBC: Recent Labs  Lab 08/13/24 0317 08/15/24 0402  WBC 6.9 6.2  HGB  12.5 12.5  HCT 38.7 39.8  MCV 89.6 88.6  PLT 264 293   Basic Metabolic Panel: Recent Labs  Lab 08/13/24 0317 08/15/24 0402  NA 138 136  K 4.1 4.0  CL 104 102  CO2 23 22  GLUCOSE 113* 116*  BUN 10 12  CREATININE 0.76 0.70  CALCIUM 9.1 9.2   GFR: Estimated Creatinine Clearance: 71.6 mL/min (by C-G formula based on SCr of 0.7 mg/dL). Liver Function Tests: No results for input(s): AST, ALT, ALKPHOS, BILITOT, PROT, ALBUMIN in the last 168 hours. No results for input(s): LIPASE, AMYLASE in the last 168 hours. No results for input(s): AMMONIA in the last 168 hours. Coagulation Profile: No results for input(s): INR, PROTIME in the last 168 hours. Cardiac Enzymes: No results for input(s): CKTOTAL, CKMB, CKMBINDEX, TROPONINI in the last 168 hours. BNP (last 3 results) No results for input(s): PROBNP in the last 8760 hours. HbA1C: No results for input(s): HGBA1C in the last 72 hours. CBG: No results for input(s): GLUCAP in the last 168 hours. Lipid Profile: No results for input(s): CHOL, HDL, LDLCALC, TRIG, CHOLHDL, LDLDIRECT in the last 72 hours. Thyroid  Function Tests: No results for input(s): TSH, T4TOTAL, FREET4, T3FREE, THYROIDAB in the last 72 hours. Anemia Panel: No results for input(s): VITAMINB12, FOLATE, FERRITIN, TIBC, IRON, RETICCTPCT in the last 72 hours. Sepsis Labs: No results for input(s): PROCALCITON, LATICACIDVEN in the last 168 hours.  Recent Results (from the past 240 hours)  Surgical PCR screen     Status: None   Collection Time: 08/12/24  4:58 PM   Specimen: Nasal Mucosa; Nasal Swab  Result Value Ref Range Status   MRSA, PCR NEGATIVE NEGATIVE Final   Staphylococcus aureus NEGATIVE NEGATIVE Final    Comment: (NOTE) The Xpert SA Assay (FDA approved for NASAL specimens in patients 24 years of age and older), is one component of a comprehensive surveillance program. It is not  intended to diagnose infection nor to guide or monitor treatment. Performed at [facility], [address], [place], [place] [address]   MRSA Next Gen by PCR, Nasal     Status: None   Collection Time: 08/14/24 10:06 AM   Specimen: Nasal Mucosa; Nasal Swab  Result Value Ref Range Status   MRSA by PCR Next Gen NOT DETECTED NOT DETECTED Final    Comment: (NOTE) The GeneXpert MRSA Assay (FDA approved for NASAL specimens only), is one component of a comprehensive MRSA colonization surveillance program. It is not intended to diagnose MRSA infection nor to guide or monitor treatment for MRSA infections. Test performance is not FDA approved in patients less than 76 years old. Performed at [facility], [address], [place], [place] [address]          Radiology Studies: No results found.       Scheduled Meds:  celecoxib   200 mg Oral BID   cholecalciferol   5,000 Units Oral Q breakfast   docusate sodium   100 mg Oral BID   enoxaparin  (LOVENOX ) injection  40 mg Subcutaneous Q24H   feeding supplement  296 mL Oral Once   levothyroxine   50 mcg Oral QHS   senna  1 tablet Oral BID   Continuous Infusions:  sodium chloride  50 mL/hr at 08/16/24 1004   sodium chloride        LOS: 2 days    Time spent: 46 minutes spent on 08/16/2024 caring for this patient face-to-face including chart review, ordering labs/tests, documenting, discussion with nursing staff, consultants, updating family and interview/physical exam    Lisa PARAS [place], DO [facility] Available via Epic secure chat 7am-7pm After these hours, please refer to coverage provider listed on [email or web address] 08/16/2024, 10:46 AM

## 2024-08-16 NOTE — Progress Notes (Addendum)
 Physical Re-eval Treatment Patient Details Name: Lisa Oliver MRN: [id number] DOB: 02-03-1951 Today's Date: 08/16/2024   History of Present Illness 73 y.o. female presented 08/11/24 with L distal fibular fx. Plan is now for ortho surgery on Sunday 8/31 per pt. -->s/p ORIF L trimalleolar ankle fx  on 08/15/24 per Dr Kit.  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    PT Comments  Pt was ind at her baseline. Currently requires +2 mod - max assist for STS and SPT transfers. Extensive assist needed d/t pt NWB status and global deconditioning. Requires multi-modal cues and frequent redirection to task. Pt verbalizes she is not as active as she stated on PT eval.  Patient will benefit from continued inpatient follow up therapy, <3 hours/day Continue PT in acute setting   If plan is discharge home, recommend the following: A lot of help with walking and/or transfers;A lot of help with bathing/dressing/bathroom;Assistance with cooking/housework;Assist for transportation;Help with stairs or ramp for entrance   Can travel by private vehicle     No  Equipment Recommendations  Wheelchair (measurements PT);Wheelchair cushion (measurements PT);Other (comment)    Recommendations for Other Services       Precautions / Restrictions Precautions Precautions: Fall Recall of Precautions/Restrictions: Intact Restrictions Weight Bearing Restrictions Per Provider Order: Yes LLE Weight Bearing Per Provider Order: Non weight bearing     Mobility  Bed Mobility Overal bed mobility: Needs Assistance Bed Mobility: Supine to Sit     Supine to sit: HOB elevated, Contact guard     General bed mobility comments: for safety, cues to progress    Transfers Overall transfer level: Needs assistance Equipment used: Rolling walker (2 wheels) Transfers: Sit to/from Stand, Bed to chair/wheelchair/BSC Sit to Stand: Mod assist, +2 physical assistance, +2 safety/equipment, From elevated surface   Step pivot transfers: Mod  assist, Max assist, +2 physical assistance, +2 safety/equipment       General transfer comment: multi-modal cues for hand placement, LE position, NWB--attempted knee walker however difficult d/t body habitus;    Ambulation/Gait                   Stairs             Wheelchair Mobility     Tilt Bed    Modified Rankin (Stroke Patients Only)       Balance Overall balance assessment: Needs assistance, History of Falls Sitting-balance support: Feet unsupported, No upper extremity supported Sitting balance-Leahy Scale: Good     Standing balance support: Bilateral upper extremity supported, Reliant on assistive device for balance, During functional activity Standing balance-Leahy Scale: Poor Standing balance comment: reliant on UEs and external assist                            Communication Communication Communication: No apparent difficulties  Cognition Arousal: Alert Behavior During Therapy: WFL for tasks assessed/performed, Anxious   PT - Cognitive impairments: No apparent impairments                         Following commands: Intact      Cueing Cueing Techniques: Verbal cues  Exercises      General Comments        Pertinent Vitals/Pain Pain Assessment Pain Assessment: Faces Faces Pain Scale: Hurts little more Pain Location: L ankle Pain Descriptors / Indicators: Sharp, Tender, Aching, Sore Pain Intervention(s): Limited activity within patient's tolerance, Monitored during session, Premedicated  before session, Repositioned    Home Living                          Prior Function            PT Goals (current goals can now be found in the care plan section) Acute Rehab PT Goals PT Goal Formulation: With patient Time For Goal Achievement: 08/26/24 Potential to Achieve Goals: Fair Progress towards PT goals: Progressing toward goals    Frequency    Min 3X/week      PT Plan      Co-evaluation               AM-PAC PT 6 Clicks Mobility   Outcome Measure  Help needed turning from your back to your side while in a flat bed without using bedrails?: A Little Help needed moving from lying on your back to sitting on the side of a flat bed without using bedrails?: A Little Help needed moving to and from a bed to a chair (including a wheelchair)?: Total Help needed standing up from a chair using your arms (e.g., wheelchair or bedside chair)?: Total Help needed to walk in hospital room?: Total Help needed climbing 3-5 steps with a railing? : Total 6 Click Score: 10    End of Session Equipment Utilized During Treatment: Gait belt Activity Tolerance: Patient tolerated treatment well;Patient limited by fatigue Patient left: in chair;with call bell/phone within reach;with family/visitor present Nurse Communication: Mobility status PT Visit Diagnosis: Difficulty in walking, not elsewhere classified (R26.2);Muscle weakness (generalized) (M62.81) Pain - Right/Left: Left Pain - part of body: Ankle and joints of foot     Time: 8762-8696 PT Time Calculation (min) (ACUTE ONLY): 26 min  Charges:    $Therapeutic Activity/re-eval  PT General Charges $$ ACUTE PT VISIT: 1 Visit                     Avaya Mcjunkins, PT  Acute Rehab Dept ([facility]) [phone]  08/16/2024    [employer] 08/16/2024, 1:29 PM

## 2024-08-16 NOTE — Progress Notes (Signed)
 PT TX NOTE   08/16/24 1700  PT Visit Information  Last PT Received On 08/16/24  Assistance Needed Pt progressing toward goals. Continues to require +2 mod assist for STS and SPT. Improved SPT with use of RW v(s knee walker used in am ) Instructed in UE exercises with green Tband. Continue PT POC   History of Present Illness 73 y.o. female presented 08/11/24 with L distal fibular fx. Plan is now for ortho surgery on Sunday 8/31 per pt. -->s/p ORIF L trimalleolar ankle fx  on 08/15/24 per Dr Kit.  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  Precautions  Precautions Fall  Recall of Precautions/Restrictions Intact  Restrictions  Weight Bearing Restrictions Per Provider Order Yes  LLE Weight Bearing Per Provider Order NWB  Pain Assessment  Pain Assessment Faces  Faces Pain Scale 4  Pain Location L ankle  Pain Descriptors / Indicators Aching;Sore  Pain Intervention(s) Limited activity within patient's tolerance;Monitored during session;Repositioned;Ice applied  Cognition  Arousal Alert  Behavior During Therapy WFL for tasks assessed/performed;Anxious  PT - Cognitive impairments No apparent impairments  Following Commands  Following commands Intact  Cueing  Cueing Techniques Verbal cues  Communication  Communication No apparent difficulties  Bed Mobility  Overal bed mobility Needs Assistance  Bed Mobility Sit to Supine  Sit to supine Contact guard assist  General bed mobility comments for safety, cues to self progress  Transfers  Overall transfer level Needs assistance  Equipment used Rolling walker (2 wheels)  Transfers Sit to/from Stand;Bed to chair/wheelchair/BSC  Sit to Stand Mod assist;+2 physical assistance;+2 safety/equipment;From elevated surface  Step pivot transfers Mod assist;Max assist;+2 physical assistance;+2 safety/equipment  General transfer comment multi-modal cues for hand placement, LE position, sequence and  NWB-+2 assist need throughout for balance, safety and to adhere to NWB   Balance  Overall balance assessment Needs assistance;History of Falls  Sitting-balance support Feet unsupported;No upper extremity supported  Sitting balance-Leahy Scale Good  Standing balance support Bilateral upper extremity supported;Reliant on assistive device for balance;During functional activity  Standing balance-Leahy Scale Poor  Standing balance comment reliant on UEs and external assist  General Exercises - Upper Extremity  Shoulder Horizontal ABduction AROM;Theraband;Strengthening  Theraband Level (Shoulder Horizontal Abduction) Level 3 (Green)  Elbow Flexion AROM;Strengthening;5 reps;Theraband  Theraband Level (Elbow Flexion) Level 3 (Green)  Elbow Extension AROM;Strengthening;Left;Theraband  Theraband Level (Elbow Extension) Level 3 (Green)  PT - End of Session  Equipment Utilized During Treatment Gait belt  Activity Tolerance Patient tolerated treatment well;Patient limited by fatigue  Patient left in chair;with call bell/phone within reach;with family/visitor present  Nurse Communication Mobility status   PT - Assessment/Plan  PT Visit Diagnosis Difficulty in walking, not elsewhere classified (R26.2);Muscle weakness (generalized) (M62.81)  Pain - Right/Left Left  Pain - part of body Ankle and joints of foot  PT Frequency (ACUTE ONLY) Min 3X/week  Follow Up Recommendations Skilled nursing-short term rehab (<3 hours/day)  Can patient physically be transported by private vehicle No  Patient can return home with the following A lot of help with walking and/or transfers;A lot of help with bathing/dressing/bathroom;Assistance with cooking/housework;Assist for transportation;Help with stairs or ramp for entrance  PT equipment Wheelchair (measurements PT);Wheelchair cushion (measurements PT);Other (comment)  AM-PAC PT 6 Clicks Mobility Outcome Measure (Version 2)  Help needed turning from your back to your side while in a flat bed without using bedrails? 3  Help needed moving  from lying on your back to sitting on the side of a flat bed without using  bedrails? 3  Help needed moving to and from a bed to a chair (including a wheelchair)? 1  Help needed standing up from a chair using your arms (e.g., wheelchair or bedside chair)? 1  Help needed to walk in hospital room? 1  Help needed climbing 3-5 steps with a railing?  1  6 Click Score 10  Consider Recommendation of Discharge To: CIR/SNF/LTACH  Progressive Mobility  What is the highest level of mobility based on the mobility assessment? Level 3 (Stands with assistance) - Balance while standing  and cannot march in place  PT Goal Progression  Progress towards PT goals Progressing toward goals  Acute Rehab PT Goals  PT Goal Formulation With patient  Time For Goal Achievement 08/26/24  Potential to Achieve Goals Fair  PT Time Calculation  PT Start Time (ACUTE ONLY) 1427  PT Stop Time (ACUTE ONLY) 1446  PT Time Calculation (min) (ACUTE ONLY) 19 min  PT General Charges  $$ ACUTE PT VISIT 1 Visit  PT Treatments  $Therapeutic Activity 8-22 mins

## 2024-08-17 ENCOUNTER — Encounter (HOSPITAL_COMMUNITY): Payer: Self-pay | Admitting: Orthopedic Surgery

## 2024-08-17 DIAGNOSIS — S82892A Other fracture of left lower leg, initial encounter for closed fracture: Secondary | ICD-10-CM | POA: Diagnosis not present

## 2024-08-17 LAB — BASIC METABOLIC PANEL WITH GFR
Anion gap: 11 (ref 5–15)
BUN: 12 mg/dL (ref 8–23)
CO2: 21 mmol/L — ABNORMAL LOW (ref 22–32)
Calcium: 9.1 mg/dL (ref 8.9–10.3)
Chloride: 104 mmol/L (ref 98–111)
Creatinine, Ser: 0.66 mg/dL (ref 0.44–1.00)
GFR, Estimated: >60 mL/min (ref >60)
Glucose, Bld: 104 mg/dL — ABNORMAL HIGH (ref 70–99)
Potassium: 4.3 mmol/L (ref 3.5–5.1)
Sodium: 136 mmol/L (ref 135–145)

## 2024-08-17 LAB — CBC
HCT: 38.4 % (ref 36.0–46.0)
Hemoglobin: 12.3 g/dL (ref 12.0–15.0)
MCH: 28.9 pg (ref 26.0–34.0)
MCHC: 32 g/dL (ref 30.0–36.0)
MCV: 90.4 fL (ref 80.0–100.0)
Platelets: 300 K/uL (ref 150–400)
RBC: 4.25 MIL/uL (ref 3.87–5.11)
RDW: 14.6 % (ref 11.5–15.5)
WBC: 6.8 K/uL (ref 4.0–10.5)
nRBC: 0 % (ref 0.0–0.2)

## 2024-08-17 MED ORDER — DOCUSATE SODIUM 100 MG PO CAPS
100.0000 mg | ORAL_CAPSULE | Freq: Two times a day (BID) | ORAL | 0 refills | Status: AC
Start: 2024-08-17 — End: ?

## 2024-08-17 MED ORDER — INFLUENZA VAC SPLIT HIGH-DOSE 0.5 ML IM SUSY
0.5000 mL | PREFILLED_SYRINGE | INTRAMUSCULAR | Status: DC
  Filled 2024-08-17: qty 0.5

## 2024-08-17 MED ORDER — RIVAROXABAN 10 MG PO TABS: 10.0000 mg | ORAL_TABLET | Freq: Every day | ORAL | 0 refills | Status: AC

## 2024-08-17 MED ORDER — SENNA 8.6 MG PO TABS: 2.0000 | ORAL_TABLET | Freq: Two times a day (BID) | ORAL | 0 refills | Status: AC

## 2024-08-17 MED ORDER — OXYCODONE HCL 5 MG PO TABS: 5.0000 mg | ORAL_TABLET | Freq: Four times a day (QID) | ORAL | 0 refills | Status: AC | PRN

## 2024-08-17 NOTE — Plan of Care (Signed)
  Problem: Education: Goal: Knowledge of General Education information will improve Description: Including pain rating scale, medication(s)/side effects and non-pharmacologic comfort measures Outcome: Progressing   Problem: Health Behavior/Discharge Planning: Goal: Ability to manage health-related needs will improve Outcome: Progressing   Problem: Clinical Measurements: Goal: Ability to maintain clinical measurements within normal limits will improve Outcome: Progressing Goal: Will remain free from infection Outcome: Progressing Goal: Diagnostic test results will improve Outcome: Progressing Goal: Respiratory complications will improve Outcome: Progressing Goal: Cardiovascular complication will be avoided Outcome: Progressing   Problem: Activity: Goal: Risk for activity intolerance will decrease Outcome: Progressing   Problem: Nutrition: Goal: Adequate nutrition will be maintained Outcome: Progressing   Problem: Coping: Goal: Level of anxiety will decrease Outcome: Progressing   Problem: Elimination: Goal: Will not experience complications related to bowel motility Outcome: Progressing Goal: Will not experience complications related to urinary retention Outcome: Progressing   Problem: Pain Managment: Goal: General experience of comfort will improve and/or be controlled Outcome: Progressing   Problem: Safety: Goal: Ability to remain free from injury will improve Outcome: Progressing   Problem: Skin Integrity: Goal: Risk for impaired skin integrity will decrease Outcome: Progressing   Problem: Activity: Goal: Ability to avoid complications of mobility impairment will improve Outcome: Progressing Goal: Ability to tolerate increased activity will improve Outcome: Progressing   Problem: Education: Goal: Verbalization of understanding the information provided will improve Outcome: Progressing   Problem: Coping: Goal: Level of anxiety will decrease Outcome:  Progressing   Problem: Physical Regulation: Goal: Postoperative complications will be avoided or minimized Outcome: Progressing   Problem: Respiratory: Goal: Ability to maintain a clear airway will improve Outcome: Progressing   Problem: Pain Management: Goal: Pain level will decrease Outcome: Progressing   Problem: Skin Integrity: Goal: Signs of wound healing will improve Outcome: Progressing   Problem: Tissue Perfusion: Goal: Ability to maintain adequate tissue perfusion will improve Outcome: Progressing

## 2024-08-17 NOTE — Discharge Instructions (Signed)
Lisa Simmer, MD [facility]  Please read the following information regarding your care after surgery.  Medications  You only need a prescription for the narcotic pain medicine (ex. oxycodone, Percocet, Norco).  All of the other medicines listed below are available over the counter. ? Aleve 2 pills twice a day for the first 3 days after surgery. ? acetominophen (Tylenol) 650 mg every 4-6 hours as you need for minor to moderate pain ? oxycodone as prescribed for severe pain  Narcotic pain medicine (ex. oxycodone, Percocet, Vicodin) will cause constipation.  To prevent this problem, take the following medicines while you are taking any pain medicine. ? docusate sodium (Colace) 100 mg twice a day ? senna (Senokot) 2 tablets twice a day  ? To help prevent blood clots, take Xarelto as prescribed for two weeks after surgery.  You should also get up every hour while you are awake to move around.    Weight Bearing ? Do not bear any weight on the operated leg or foot.  Cast / Splint / Dressing ? Keep your splint, cast or dressing clean and dry.  Don't put anything (coat hanger, pencil, etc) down inside of it.  If it gets damp, use a hair dryer on the cool setting to dry it.  If it gets soaked, call the office to schedule an appointment for a cast change.    After your dressing, cast or splint is removed; you may shower, but do not soak or scrub the wound.  Allow the water to run over it, and then gently pat it dry.  Swelling It is normal for you to have swelling where you had surgery.  To reduce swelling and pain, keep your toes above your nose for at least 3 days after surgery.  It may be necessary to keep your foot or leg elevated for several weeks.  If it hurts, it should be elevated.  Follow Up Call my office at [phone] when you are discharged from the hospital or surgery center to schedule an appointment to be seen two weeks after surgery.  Call my office at [phone] if you develop  a fever >101.5 F, nausea, vomiting, bleeding from the surgical site or severe pain.

## 2024-08-17 NOTE — Progress Notes (Signed)
 PROGRESS NOTE    Lisa Oliver  FMW:[id number] DOB: Oct 27, 1951 DOA: 08/11/2024 PCP: Seabron Lenis, MD    Brief Narrative:   Lisa Oliver is a 73 y.o. female with past medical history significant for hypothyroidism who presented to [facility] ED on 08/11/2024 after sustaining a mechanical fall while leaving the gym with left ankle pain.  Workup in the ED with imaging findings consistent with mildly displaced fracture distal left fibula, medial malleolus with disruption of ankle mortise. She has been in the ER since yesterday, social work has been involved and she has also been seen by PT/OT, they were hoping to get her placed at a subacute nursing facility however this has not happened yet. Unfortunately she is too weak in her upper body to manage with crutches, tells me that she cannot navigate with a walker at home. As such, hospitalist service was asked to admit the patient to the hospital with anticipated surgical repair with Dr. Kit on 8/31.   Assessment & Plan:   Left ankle bimalleolar fracture Patient presenting to ED after mechanical fall with subsequent left ankle pain.  Imaging on arrival notable for mildly displaced fracture of distal left fibula, medial malleolus with the reduction of the ankle mortise.  Orthopedics was consulted and patient underwent open treatment left trimalleolar fracture with internal fixation. -- Orthopedics following, appreciate assistance -- NWB LLE -- Xarelto  for DVT prophylaxis postoperatively x 2 weeks per orthopedics -- Percocet 5-325 mg p.o. every 6 hours as needed moderate pain -- Dilaudid  0.5-1 mg IV every 2 hours as needed severe pain -- Robaxin  5 mg p.o. every 6 hours PRN muscle spasms -- PT/OT following with recommendation of SNF placement, TOC consulted  Hypothyroidism -- Levothyroxine  50 mcg p.o. daily   DVT prophylaxis: enoxaparin  (LOVENOX ) injection 40 mg Start: 08/16/24 0800 SCDs Start: 08/15/24 1018    Code Status: Full Code Family  Communication: No family present at bedside  Disposition Plan:  Level of care: Med-Surg Status is: Inpatient Remains inpatient appropriate because: Pending SNF placement      Consultants:  Orthopedics, Dr. Kit  Procedures:   internal fixation, bimalleolar fracture; Dr. Kit; 8/31  Antimicrobials:  Perioperative vancomycin /cefazolin    Subjective: Patient seen examined bedside, lying in bed.  Eating breakfast.  No specific complaints this morning.  Pain controlled.  Seen by orthopedic PA this morning.  PT/OT recommending SNF placement, patient agreeable.  Awaiting TOC consultation. Denies headache, no chest pain, no shortness of breath, no abdominal pain, no fever/chills/night sweats, no nausea/vomiting/diarrhea.  No acute events overnight per nursing staff.  Objective: Vitals:   08/16/24 1021 08/16/24 1404 08/16/24 2104 08/17/24 0507  BP: 124/60 127/78 135/62 128/70  Pulse:  78 75 66  Resp:  16 18 18   Temp:  (!) 97.5 F (36.4 C) 97.7 F (36.5 C) 97.8 F (36.6 C)  TempSrc:  Oral Oral Oral  SpO2:  99% 98% 100%  Weight:      Height:        Intake/Output Summary (Last 24 hours) at 08/17/2024 1105 Last data filed at 08/17/2024 0645 Gross per 24 hour  Intake 1843.29 ml  Output 1600 ml  Net 243.29 ml   Filed Weights   08/11/24 1519  Weight: 87.1 kg    Examination:  Physical Exam: GEN: NAD, alert and oriented x 3, wd/wn HEENT: NCAT, PERRL, EOMI, sclera clear, MMM PULM: CTAB w/o wheezes/crackles, normal respiratory effort, room air CV: RRR w/o M/G/R GI: abd soft, NTND, NABS, no R/G/M MSK: no  peripheral edema, left foot/ankle with splint/Ace wrap in place, neurovascularly intact NEURO: CN II-XII intact, no focal deficits PSYCH: normal mood/affect Integumentary: dry/intact, no rashes or wounds    Data Reviewed: I have personally reviewed following labs and imaging studies  CBC: Recent Labs  Lab 08/13/24 0317 08/15/24 0402 08/17/24 0320  WBC 6.9 6.2 6.8   HGB 12.5 12.5 12.3  HCT 38.7 39.8 38.4  MCV 89.6 88.6 90.4  PLT 264 293 300   Basic Metabolic Panel: Recent Labs  Lab 08/13/24 0317 08/15/24 0402 08/17/24 0320  NA 138 136 136  K 4.1 4.0 4.3  CL 104 102 104  CO2 23 22 21*  GLUCOSE 113* 116* 104*  BUN 10 12 12   CREATININE 0.76 0.70 0.66  CALCIUM 9.1 9.2 9.1   GFR: Estimated Creatinine Clearance: 71.6 mL/min (by C-G formula based on SCr of 0.66 mg/dL). Liver Function Tests: No results for input(s): AST, ALT, ALKPHOS, BILITOT, PROT, ALBUMIN in the last 168 hours. No results for input(s): LIPASE, AMYLASE in the last 168 hours. No results for input(s): AMMONIA in the last 168 hours. Coagulation Profile: No results for input(s): INR, PROTIME in the last 168 hours. Cardiac Enzymes: No results for input(s): CKTOTAL, CKMB, CKMBINDEX, TROPONINI in the last 168 hours. BNP (last 3 results) No results for input(s): PROBNP in the last 8760 hours. HbA1C: No results for input(s): HGBA1C in the last 72 hours. CBG: No results for input(s): GLUCAP in the last 168 hours. Lipid Profile: No results for input(s): CHOL, HDL, LDLCALC, TRIG, CHOLHDL, LDLDIRECT in the last 72 hours. Thyroid  Function Tests: No results for input(s): TSH, T4TOTAL, FREET4, T3FREE, THYROIDAB in the last 72 hours. Anemia Panel: No results for input(s): VITAMINB12, FOLATE, FERRITIN, TIBC, IRON, RETICCTPCT in the last 72 hours. Sepsis Labs: No results for input(s): PROCALCITON, LATICACIDVEN in the last 168 hours.  Recent Results (from the past 240 hours)  Surgical PCR screen     Status: None   Collection Time: 08/12/24  4:58 PM   Specimen: Nasal Mucosa; Nasal Swab  Result Value Ref Range Status   MRSA, PCR NEGATIVE NEGATIVE Final   Staphylococcus aureus NEGATIVE NEGATIVE Final    Comment: (NOTE) The Xpert SA Assay (FDA approved for NASAL specimens in patients 30 years of age and older), is  one component of a comprehensive surveillance program. It is not intended to diagnose infection nor to guide or monitor treatment. Performed at [facility], [address], [place], [place] [address]   MRSA Next Gen by PCR, Nasal     Status: None   Collection Time: 08/14/24 10:06 AM   Specimen: Nasal Mucosa; Nasal Swab  Result Value Ref Range Status   MRSA by PCR Next Gen NOT DETECTED NOT DETECTED Final    Comment: (NOTE) The GeneXpert MRSA Assay (FDA approved for NASAL specimens only), is one component of a comprehensive MRSA colonization surveillance program. It is not intended to diagnose MRSA infection nor to guide or monitor treatment for MRSA infections. Test performance is not FDA approved in patients less than 23 years old. Performed at [facility], [address] [place], [place] [address]          Radiology Studies: No results found.       Scheduled Meds:  celecoxib   200 mg Oral BID   cholecalciferol   5,000 Units Oral Q breakfast   docusate sodium   100 mg Oral BID   enoxaparin  (LOVENOX ) injection  40 mg Subcutaneous Q24H  feeding supplement  296 mL Oral Once   levothyroxine   50 mcg Oral QHS   senna  1 tablet Oral BID   Continuous Infusions:  sodium chloride  Stopped (08/17/24 0645)   sodium chloride        LOS: 2 days    Time spent: 46 minutes spent on 08/17/2024 caring for this patient face-to-face including chart review, ordering labs/tests, documenting, discussion with nursing staff, consultants, updating family and interview/physical exam    Camellia PARAS [place], DO [facility] Available via Epic secure chat 7am-7pm After these hours, please refer to coverage provider listed on [email or web address] 08/17/2024, 11:05 AM

## 2024-08-17 NOTE — TOC Progression Note (Signed)
 Transition of Care [facility]) - Progression Note    Patient Details  Name: Lisa Oliver MRN: [id number] Date of Birth: 01-Feb-1951  [facility] CM/SW Contact  NORMAN ASPEN, LCSW Phone Number: 08/17/2024, 1:15 PM  Clinical Narrative:     Confirmed with pt and spouse the plan for SNF as recommended by PT.  SNF bed offers reviewed and pt has accepted bed with [facility].  Confirmed with facility that bed will be available tomorrow and MD confirms pt will be medically ready.  Have begun insurance authorization.    Expected Discharge Plan: Skilled Nursing Facility Barriers to Discharge: Continued Medical Work up               Expected Discharge Plan and Services In-house Referral: Clinical Social Work     Living arrangements for the past 2 months: Single Family Home                                       Social Drivers of Health (SDOH) Interventions SDOH Screenings   Food Insecurity: No Food Insecurity (08/12/2024)  Housing: Low Risk  (08/12/2024)  Transportation Needs: No Transportation Needs (08/12/2024)  Utilities: Not At Risk (08/12/2024)  Social Connections: Socially Integrated (08/12/2024)  Tobacco Use: Low Risk  (08/15/2024)    Readmission Risk Interventions    08/17/2024    1:15 PM  Readmission Risk Prevention Plan  Post Dischage Appt Complete  Medication Screening Complete  Transportation Screening Complete

## 2024-08-17 NOTE — Plan of Care (Signed)
 ?  Problem: Clinical Measurements: ?Goal: Will remain free from infection ?Outcome: Progressing ?  ?

## 2024-08-17 NOTE — Progress Notes (Signed)
 Subjective: 2 Days Post-Op Procedure(s) (LRB): OPEN REDUCTION INTERNAL FIXATION (ORIF) ANKLE FRACTURE (Left)  Patient reports pain as mild to moderate.  Denies fever, chills, N/V, CP, SOB.  Tolerating POs well. Admits to flatus.  Objective:   VITALS:  Temp:  [97.5 F (36.4 C)-97.8 F (36.6 C)] 97.8 F (36.6 C) (09/02 0507) Pulse Rate:  [63-78] 66 (09/02 0507) Resp:  [16-18] 18 (09/02 0507) BP: (68-135)/(47-78) 128/70 (09/02 0507) SpO2:  [96 %-100 %] 100 % (09/02 0507)  General: WDWN patient in NAD. Psych:  Appropriate mood and affect. Neuro:  A&O x 3, Moving all extremities, sensation subjectively diminished to light touch HEENT:  EOMs intact Chest:  Even non-labored respirations Skin:  SLS C/D/I, no rashes or lesions Extremities: warm/dry, no visible edema, erythema or echymosis.  No lymphadenopathy. Pulses: Popliteus 2+ MSK:  ROM: EHL/FHL intact, MMT: able to perform quad set   LABS Recent Labs    08/15/24 0402 08/17/24 0320  HGB 12.5 12.3  WBC 6.2 6.8  PLT 293 300   Recent Labs    08/15/24 0402 08/17/24 0320  NA 136 136  K 4.0 4.3  CL 102 104  CO2 22 21*  BUN 12 12  CREATININE 0.70 0.66  GLUCOSE 116* 104*   No results for input(s): LABPT, INR in the last 72 hours.   Assessment/Plan: 2 Days Post-Op Procedure(s) (LRB): OPEN REDUCTION INTERNAL FIXATION (ORIF) ANKLE FRACTURE (Left)  NWB L LE Up with therapy DVT ppx:  Lovenox  in house: transition to Xarelto  upon D/C Disp:  Likely SNF Rx for oxycodone  provided after searching PDMP D/C Rxs placed on chart. Plan for 2 week outpatient post-op visit with Dr. Kit.  Eva Barrack PA-C [facility] Office:  [phone]

## 2024-08-18 DIAGNOSIS — S82842D Displaced bimalleolar fracture of left lower leg, subsequent encounter for closed fracture with routine healing: Secondary | ICD-10-CM | POA: Diagnosis not present

## 2024-08-18 DIAGNOSIS — M6281 Muscle weakness (generalized): Secondary | ICD-10-CM | POA: Diagnosis not present

## 2024-08-18 DIAGNOSIS — Z7401 Bed confinement status: Secondary | ICD-10-CM | POA: Diagnosis not present

## 2024-08-18 DIAGNOSIS — R2689 Other abnormalities of gait and mobility: Secondary | ICD-10-CM | POA: Diagnosis not present

## 2024-08-18 DIAGNOSIS — Z743 Need for continuous supervision: Secondary | ICD-10-CM | POA: Diagnosis not present

## 2024-08-18 DIAGNOSIS — K5909 Other constipation: Secondary | ICD-10-CM | POA: Diagnosis not present

## 2024-08-18 DIAGNOSIS — R1310 Dysphagia, unspecified: Secondary | ICD-10-CM | POA: Diagnosis not present

## 2024-08-18 DIAGNOSIS — S82892D Other fracture of left lower leg, subsequent encounter for closed fracture with routine healing: Secondary | ICD-10-CM | POA: Diagnosis not present

## 2024-08-18 DIAGNOSIS — S82843A Displaced bimalleolar fracture of unspecified lower leg, initial encounter for closed fracture: Secondary | ICD-10-CM | POA: Diagnosis not present

## 2024-08-18 DIAGNOSIS — E559 Vitamin D deficiency, unspecified: Secondary | ICD-10-CM | POA: Diagnosis not present

## 2024-08-18 DIAGNOSIS — E039 Hypothyroidism, unspecified: Secondary | ICD-10-CM | POA: Diagnosis not present

## 2024-08-18 DIAGNOSIS — M84472D Pathological fracture, left ankle, subsequent encounter for fracture with routine healing: Secondary | ICD-10-CM | POA: Diagnosis not present

## 2024-08-18 DIAGNOSIS — Z9181 History of falling: Secondary | ICD-10-CM | POA: Diagnosis not present

## 2024-08-18 DIAGNOSIS — S99919A Unspecified injury of unspecified ankle, initial encounter: Secondary | ICD-10-CM | POA: Diagnosis not present

## 2024-08-18 DIAGNOSIS — R6889 Other general symptoms and signs: Secondary | ICD-10-CM | POA: Diagnosis not present

## 2024-08-18 DIAGNOSIS — S82852D Displaced trimalleolar fracture of left lower leg, subsequent encounter for closed fracture with routine healing: Secondary | ICD-10-CM | POA: Diagnosis not present

## 2024-08-18 MED ORDER — CELECOXIB 200 MG PO CAPS: 200.0000 mg | ORAL_CAPSULE | Freq: Two times a day (BID) | ORAL | 0 refills | Status: AC

## 2024-08-18 NOTE — Discharge Summary (Signed)
 Physician Discharge Summary  Lisa Oliver FMW:[id number] DOB: Dec 09, 1951 DOA: 08/11/2024  PCP: Seabron Lenis, MD  Admit date: 08/11/2024 Discharge date: 08/18/2024  Admitted From: Home  Disposition: SNF  Recommendations for Outpatient Follow-up:  Follow up with PCP in 1-2 weeks Please obtain BMP/CBC in one week Please follow up with Ortho:  Home Health: None  Equipment/Devices none Discharge Condition: Stable CODE STATUS: Full Diet recommendation: cardiac Brief/Interim Summary:73 y.o. female with past medical history significant for hypothyroidism who presented to [facility] ED on 08/11/2024 after sustaining a mechanical fall while leaving the gym with left ankle pain.  Workup in the ED with imaging findings consistent with mildly displaced fracture distal left fibula, medial malleolus with disruption of ankle mortise. She has been in the ER since yesterday, social work has been involved and she has also been seen by PT/OT, they were hoping to get her placed at a subacute nursing facility however this has not happened yet. Unfortunately she is too weak in her upper body to manage with crutches, tells me that she cannot navigate with a walker at home. As such, hospitalist service was asked to admit the patient to the hospital with anticipated surgical repair with Dr. Kit on 8/31.      Discharge Diagnoses:  Principal Problem:   Ankle fracture    Left ankle bimalleolar fracture Patient presenting to ED after mechanical fall with subsequent left ankle pain.  Imaging on arrival notable for mildly displaced fracture of distal left fibula, medial malleolus with the reduction of the ankle mortise.  Orthopedics was consulted and patient underwent open treatment left trimalleolar fracture with internal fixation.  Ortho recommends nonweightbearing to the left lower extremity, Xarelto  for DVT prophylaxis Follow-up with Ortho  Seen by PT recommending SNF discharge.    Hypothyroidism -- Levothyroxine  50  mcg p.o. daily   Estimated body mass index is 28.35 kg/m as calculated from the following:   Height as of this encounter: 5' 9 (1.753 m).   Weight as of this encounter: 87.1 kg.  Discharge Instructions  Discharge Instructions     Diet - low sodium heart healthy   Complete by: As directed    Increase activity slowly   Complete by: As directed    No wound care   Complete by: As directed       Allergies as of 08/18/2024       Reactions   Depo-medrol [methylprednisolone] Hives, Itching, Dermatitis, Rash, Other (See Comments)   Able to tolerate oral prednisone, however        Medication List     STOP taking these medications    Advil  200 MG tablet Generic drug: ibuprofen        TAKE these medications    celecoxib  200 MG capsule Commonly known as: CELEBREX  Take 1 capsule (200 mg total) by mouth 2 (two) times daily.   DIGESTIVE ENZYME PO Take 1 capsule by mouth daily with breakfast. Enzyme Forte   docusate sodium  100 MG capsule Commonly known as: Colace Take 1 capsule (100 mg total) by mouth 2 (two) times daily. While taking narcotic pain medicine.   metroNIDAZOLE 1 % gel Commonly known as: METROGEL Apply 1 application  topically daily as needed (for rosacea).   Natural Balance Tears 0.1-0.3 % Soln Generic drug: Dextran 70-Hypromellose Place 1 drop into both eyes at bedtime as needed (DRY/IRRITATED EYES.).   oxyCODONE  5 MG immediate release tablet Commonly known as: Roxicodone  Take 1 tablet (5 mg total) by mouth every 6 (six) hours  as needed for up to 5 days.   rivaroxaban  10 MG Tabs tablet Commonly known as: Xarelto  Take 1 tablet (10 mg total) by mouth daily.   senna 8.6 MG Tabs tablet Commonly known as: SENOKOT Take 2 tablets (17.2 mg total) by mouth 2 (two) times daily.   Synthroid  50 MCG tablet Generic drug: levothyroxine  Take 50 mcg by mouth every evening.   Vitamin D -3 125 MCG (5000 UT) Tabs Take 5,000 Units by mouth daily with breakfast.         Follow-up Information     Kit Rush, MD. Schedule an appointment as soon as possible for a visit in 2 week(s).   Specialty: Orthopedic Surgery Contact information: [address] [place] [address] [place] [address] [phone]                Allergies  Allergen Reactions   Depo-Medrol [Methylprednisolone] Hives, Itching, Dermatitis, Rash and Other (See Comments)    Able to tolerate oral prednisone, however    Consultations: ortho   Procedures/Studies: DG Ankle Complete Left Result Date: 08/11/2024 CLINICAL DATA:  Patient tripped and fell down the stairs. EXAM: LEFT ANKLE COMPLETE - 3+ VIEW COMPARISON:  None Available. FINDINGS: Bone mineralization within normal limits for patient's age. There is mildly displaced oblique fracture of the distal left fibula. There is also mildly displaced fracture of the medial malleolus with resultant disruption of ankle mortise. No other acute fracture or dislocation. No aggressive osseous lesion. Mild diffuse soft tissue swelling about the ankle joint. No radiopaque foreign bodies. IMPRESSION: *Mildly displaced fractures of the distal left fibula and medial malleolus with resultant disruption of ankle mortise. Electronically Signed   By: Ree Molt M.D.   On: 08/11/2024 15:51   (Echo, Carotid, EGD, Colonoscopy, ERCP)    Subjective: Resting in bed anxious to be discharged anxious about the next step  Discharge Exam: Vitals:   08/18/24 0607 08/18/24 0807  BP: (!) 153/75 (!) 140/63  Pulse: 68   Resp: 18 17  Temp: 97.8 F (36.6 C)   SpO2: 97% 96%   Vitals:   08/17/24 1318 08/17/24 2010 08/18/24 0607 08/18/24 0807  BP: 138/70 (!) 154/59 (!) 153/75 (!) 140/63  Pulse: 85 99 68   Resp: 16 18 18 17   Temp: 98.9 F (37.2 C) 98.1 F (36.7 C) 97.8 F (36.6 C)   TempSrc: Oral Oral Oral   SpO2: 99% 96% 97% 96%  Weight:      Height:        General: Pt is alert, awake, not in acute distress Cardiovascular: RRR, S1/S2  +, no rubs, no gallops Respiratory: CTA bilaterally, no wheezing, no rhonchi Abdominal: Soft, NT, ND, bowel sounds + Extremities: Lower extremity covered with dressing  The results of significant diagnostics from this hospitalization (including imaging, microbiology, ancillary and laboratory) are listed below for reference.     Microbiology: Recent Results (from the past 240 hours)  Surgical PCR screen     Status: None   Collection Time: 08/12/24  4:58 PM   Specimen: Nasal Mucosa; Nasal Swab  Result Value Ref Range Status   MRSA, PCR NEGATIVE NEGATIVE Final   Staphylococcus aureus NEGATIVE NEGATIVE Final    Comment: (NOTE) The Xpert SA Assay (FDA approved for NASAL specimens in patients 90 years of age and older), is one component of a comprehensive surveillance program. It is not intended to diagnose infection nor to guide or monitor treatment. Performed at [facility], [address] [place], [place]  [address]   MRSA Next Gen by PCR, Nasal     Status: None   Collection Time: 08/14/24 10:06 AM   Specimen: Nasal Mucosa; Nasal Swab  Result Value Ref Range Status   MRSA by PCR Next Gen NOT DETECTED NOT DETECTED Final    Comment: (NOTE) The GeneXpert MRSA Assay (FDA approved for NASAL specimens only), is one component of a comprehensive MRSA colonization surveillance program. It is not intended to diagnose MRSA infection nor to guide or monitor treatment for MRSA infections. Test performance is not FDA approved in patients less than 65 years old. Performed at [facility], [address], [place], [place] [address]      Labs: BNP (last 3 results) No results for input(s): BNP in the last 8760 hours. Basic Metabolic Panel: Recent Labs  Lab 08/13/24 0317 08/15/24 0402 08/17/24 0320  NA 138 136 136  K 4.1 4.0 4.3  CL 104 102 104  CO2 23 22 21*  GLUCOSE 113* 116* 104*  BUN 10 12 12   CREATININE 0.76 0.70 0.66  CALCIUM 9.1 9.2  9.1   Liver Function Tests: No results for input(s): AST, ALT, ALKPHOS, BILITOT, PROT, ALBUMIN in the last 168 hours. No results for input(s): LIPASE, AMYLASE in the last 168 hours. No results for input(s): AMMONIA in the last 168 hours. CBC: Recent Labs  Lab 08/13/24 0317 08/15/24 0402 08/17/24 0320  WBC 6.9 6.2 6.8  HGB 12.5 12.5 12.3  HCT 38.7 39.8 38.4  MCV 89.6 88.6 90.4  PLT 264 293 300   Cardiac Enzymes: No results for input(s): CKTOTAL, CKMB, CKMBINDEX, TROPONINI in the last 168 hours. BNP: Invalid input(s): POCBNP CBG: No results for input(s): GLUCAP in the last 168 hours. D-Dimer No results for input(s): DDIMER in the last 72 hours. Hgb A1c No results for input(s): HGBA1C in the last 72 hours. Lipid Profile No results for input(s): CHOL, HDL, LDLCALC, TRIG, CHOLHDL, LDLDIRECT in the last 72 hours. Thyroid  function studies No results for input(s): TSH, T4TOTAL, T3FREE, THYROIDAB in the last 72 hours.  Invalid input(s): FREET3 Anemia work up No results for input(s): VITAMINB12, FOLATE, FERRITIN, TIBC, IRON, RETICCTPCT in the last 72 hours. Urinalysis No results found for: COLORURINE, APPEARANCEUR, LABSPEC, PHURINE, GLUCOSEU, HGBUR, BILIRUBINUR, KETONESUR, PROTEINUR, UROBILINOGEN, NITRITE, LEUKOCYTESUR Sepsis Labs Recent Labs  Lab 08/13/24 0317 08/15/24 0402 08/17/24 0320  WBC 6.9 6.2 6.8   Microbiology Recent Results (from the past 240 hours)  Surgical PCR screen     Status: None   Collection Time: 08/12/24  4:58 PM   Specimen: Nasal Mucosa; Nasal Swab  Result Value Ref Range Status   MRSA, PCR NEGATIVE NEGATIVE Final   Staphylococcus aureus NEGATIVE NEGATIVE Final    Comment: (NOTE) The Xpert SA Assay (FDA approved for NASAL specimens in patients 79 years of age and older), is one component of a comprehensive surveillance program. It is not intended to diagnose  infection nor to guide or monitor treatment. Performed at [facility], [address] [place], [place] [address]   MRSA Next Gen by PCR, Nasal     Status: None   Collection Time: 08/14/24 10:06 AM   Specimen: Nasal Mucosa; Nasal Swab  Result Value Ref Range Status   MRSA by PCR Next Gen NOT DETECTED NOT DETECTED Final    Comment: (NOTE) The GeneXpert MRSA Assay (FDA approved for NASAL specimens only), is one component of a comprehensive MRSA colonization surveillance program. It is not intended to diagnose MRSA  infection nor to guide or monitor treatment for MRSA infections. Test performance is not FDA approved in patients less than 82 years old. Performed at [facility], [address], [place], [place] [address]      Time coordinating discharge:  39 min SIGNED:   Almarie KANDICE Hoots, MD  [facility] 08/18/2024, 9:17 AM

## 2024-08-18 NOTE — TOC Transition Note (Signed)
 Transition of Care [facility]) - Discharge Note   Patient Details  Name: Lisa Oliver MRN: [id number] Date of Birth: 1951-10-22  [facility] CM/SW Contact:  NORMAN ASPEN, LCSW Phone Number: 08/18/2024, 10:35 AM   Clinical Narrative:     Pt medically cleared for dc to [facility] today and have received insurance authorization.  Pt and spouse aware and agreeable.  PTAR called at 10:30am.  RN to call report to [phone].  No further IPCM needs.   Final next level of care: Skilled Nursing Facility Barriers to Discharge: Barriers Resolved   Patient Goals and CMS Choice Patient states their goals for this hospitalization and ongoing recovery are:: Rehab if possible          Discharge Placement PASRR number recieved: 08/12/24            Patient chooses bed at: [facility] Patient to be transferred to facility by: PTAR Name of family member notified: spouse Patient and family notified of of transfer: 08/18/24  Discharge Plan and Services Additional resources added to the After Visit Summary for   In-house Referral: Clinical Social Work              DME Arranged: N/A DME Agency: NA                  Social Drivers of Health (SDOH) Interventions SDOH Screenings   Food Insecurity: No Food Insecurity (08/12/2024)  Housing: Low Risk  (08/12/2024)  Transportation Needs: No Transportation Needs (08/12/2024)  Utilities: Not At Risk (08/12/2024)  Social Connections: Socially Integrated (08/12/2024)  Tobacco Use: Low Risk  (08/15/2024)     Readmission Risk Interventions    08/17/2024    1:15 PM  Readmission Risk Prevention Plan  Post Dischage Appt Complete  Medication Screening Complete  Transportation Screening Complete

## 2024-08-18 NOTE — Plan of Care (Signed)
  Problem: Activity: Goal: Risk for activity intolerance will decrease Outcome: Progressing   Problem: Safety: Goal: Ability to remain free from injury will improve Outcome: Progressing   Problem: Pain Managment: Goal: General experience of comfort will improve and/or be controlled Outcome: Progressing

## 2024-08-18 NOTE — Progress Notes (Signed)
 Called [facility] and report given to nurse for room 506.

## 2024-08-19 DIAGNOSIS — S82842D Displaced bimalleolar fracture of left lower leg, subsequent encounter for closed fracture with routine healing: Secondary | ICD-10-CM | POA: Diagnosis not present

## 2024-08-19 DIAGNOSIS — K5909 Other constipation: Secondary | ICD-10-CM | POA: Diagnosis not present

## 2024-08-19 DIAGNOSIS — E559 Vitamin D deficiency, unspecified: Secondary | ICD-10-CM | POA: Diagnosis not present

## 2024-08-19 DIAGNOSIS — E039 Hypothyroidism, unspecified: Secondary | ICD-10-CM | POA: Diagnosis not present

## 2024-08-19 DIAGNOSIS — M6281 Muscle weakness (generalized): Secondary | ICD-10-CM | POA: Diagnosis not present

## 2024-08-19 DIAGNOSIS — M84472D Pathological fracture, left ankle, subsequent encounter for fracture with routine healing: Secondary | ICD-10-CM | POA: Diagnosis not present

## 2024-08-20 DIAGNOSIS — E039 Hypothyroidism, unspecified: Secondary | ICD-10-CM | POA: Diagnosis not present

## 2024-08-20 DIAGNOSIS — K5909 Other constipation: Secondary | ICD-10-CM | POA: Diagnosis not present

## 2024-08-20 DIAGNOSIS — M84472D Pathological fracture, left ankle, subsequent encounter for fracture with routine healing: Secondary | ICD-10-CM | POA: Diagnosis not present

## 2024-08-20 DIAGNOSIS — M6281 Muscle weakness (generalized): Secondary | ICD-10-CM | POA: Diagnosis not present

## 2024-08-20 DIAGNOSIS — E559 Vitamin D deficiency, unspecified: Secondary | ICD-10-CM | POA: Diagnosis not present

## 2024-08-23 DIAGNOSIS — R2689 Other abnormalities of gait and mobility: Secondary | ICD-10-CM | POA: Diagnosis not present

## 2024-08-23 DIAGNOSIS — K5909 Other constipation: Secondary | ICD-10-CM | POA: Diagnosis not present

## 2024-08-23 DIAGNOSIS — M6281 Muscle weakness (generalized): Secondary | ICD-10-CM | POA: Diagnosis not present

## 2024-08-23 DIAGNOSIS — M84472D Pathological fracture, left ankle, subsequent encounter for fracture with routine healing: Secondary | ICD-10-CM | POA: Diagnosis not present

## 2024-08-23 DIAGNOSIS — E559 Vitamin D deficiency, unspecified: Secondary | ICD-10-CM | POA: Diagnosis not present

## 2024-08-23 DIAGNOSIS — E039 Hypothyroidism, unspecified: Secondary | ICD-10-CM | POA: Diagnosis not present

## 2024-08-23 DIAGNOSIS — S82842D Displaced bimalleolar fracture of left lower leg, subsequent encounter for closed fracture with routine healing: Secondary | ICD-10-CM | POA: Diagnosis not present

## 2024-08-25 DIAGNOSIS — M6281 Muscle weakness (generalized): Secondary | ICD-10-CM | POA: Diagnosis not present

## 2024-08-25 DIAGNOSIS — E039 Hypothyroidism, unspecified: Secondary | ICD-10-CM | POA: Diagnosis not present

## 2024-08-25 DIAGNOSIS — M84472D Pathological fracture, left ankle, subsequent encounter for fracture with routine healing: Secondary | ICD-10-CM | POA: Diagnosis not present

## 2024-08-25 DIAGNOSIS — K5909 Other constipation: Secondary | ICD-10-CM | POA: Diagnosis not present

## 2024-08-25 DIAGNOSIS — E559 Vitamin D deficiency, unspecified: Secondary | ICD-10-CM | POA: Diagnosis not present

## 2024-08-26 DIAGNOSIS — R2689 Other abnormalities of gait and mobility: Secondary | ICD-10-CM | POA: Diagnosis not present

## 2024-08-26 DIAGNOSIS — M6281 Muscle weakness (generalized): Secondary | ICD-10-CM | POA: Diagnosis not present

## 2024-08-26 DIAGNOSIS — M84472D Pathological fracture, left ankle, subsequent encounter for fracture with routine healing: Secondary | ICD-10-CM | POA: Diagnosis not present

## 2024-08-26 DIAGNOSIS — S82842D Displaced bimalleolar fracture of left lower leg, subsequent encounter for closed fracture with routine healing: Secondary | ICD-10-CM | POA: Diagnosis not present

## 2024-08-26 DIAGNOSIS — E039 Hypothyroidism, unspecified: Secondary | ICD-10-CM | POA: Diagnosis not present

## 2024-08-26 DIAGNOSIS — E559 Vitamin D deficiency, unspecified: Secondary | ICD-10-CM | POA: Diagnosis not present

## 2024-08-26 DIAGNOSIS — K5909 Other constipation: Secondary | ICD-10-CM | POA: Diagnosis not present

## 2024-08-27 DIAGNOSIS — S82852D Displaced trimalleolar fracture of left lower leg, subsequent encounter for closed fracture with routine healing: Secondary | ICD-10-CM | POA: Diagnosis not present

## 2024-08-30 DIAGNOSIS — S82842D Displaced bimalleolar fracture of left lower leg, subsequent encounter for closed fracture with routine healing: Secondary | ICD-10-CM | POA: Diagnosis not present

## 2024-08-30 DIAGNOSIS — E039 Hypothyroidism, unspecified: Secondary | ICD-10-CM | POA: Diagnosis not present

## 2024-08-30 DIAGNOSIS — E559 Vitamin D deficiency, unspecified: Secondary | ICD-10-CM | POA: Diagnosis not present

## 2024-08-30 DIAGNOSIS — M84472D Pathological fracture, left ankle, subsequent encounter for fracture with routine healing: Secondary | ICD-10-CM | POA: Diagnosis not present

## 2024-08-30 DIAGNOSIS — K5909 Other constipation: Secondary | ICD-10-CM | POA: Diagnosis not present

## 2024-08-30 DIAGNOSIS — M6281 Muscle weakness (generalized): Secondary | ICD-10-CM | POA: Diagnosis not present

## 2024-08-30 DIAGNOSIS — R2689 Other abnormalities of gait and mobility: Secondary | ICD-10-CM | POA: Diagnosis not present

## 2024-09-01 DIAGNOSIS — M6281 Muscle weakness (generalized): Secondary | ICD-10-CM | POA: Diagnosis not present

## 2024-09-01 DIAGNOSIS — M84472D Pathological fracture, left ankle, subsequent encounter for fracture with routine healing: Secondary | ICD-10-CM | POA: Diagnosis not present

## 2024-09-01 DIAGNOSIS — E039 Hypothyroidism, unspecified: Secondary | ICD-10-CM | POA: Diagnosis not present

## 2024-09-01 DIAGNOSIS — E559 Vitamin D deficiency, unspecified: Secondary | ICD-10-CM | POA: Diagnosis not present

## 2024-09-01 DIAGNOSIS — K5909 Other constipation: Secondary | ICD-10-CM | POA: Diagnosis not present

## 2024-09-02 DIAGNOSIS — R2689 Other abnormalities of gait and mobility: Secondary | ICD-10-CM | POA: Diagnosis not present

## 2024-09-02 DIAGNOSIS — M6281 Muscle weakness (generalized): Secondary | ICD-10-CM | POA: Diagnosis not present

## 2024-09-02 DIAGNOSIS — S82842D Displaced bimalleolar fracture of left lower leg, subsequent encounter for closed fracture with routine healing: Secondary | ICD-10-CM | POA: Diagnosis not present

## 2024-09-03 DIAGNOSIS — R2689 Other abnormalities of gait and mobility: Secondary | ICD-10-CM | POA: Diagnosis not present

## 2024-09-03 DIAGNOSIS — S82892D Other fracture of left lower leg, subsequent encounter for closed fracture with routine healing: Secondary | ICD-10-CM | POA: Diagnosis not present

## 2024-09-03 DIAGNOSIS — M6281 Muscle weakness (generalized): Secondary | ICD-10-CM | POA: Diagnosis not present

## 2024-09-06 DIAGNOSIS — E039 Hypothyroidism, unspecified: Secondary | ICD-10-CM | POA: Diagnosis not present

## 2024-09-06 DIAGNOSIS — M84472D Pathological fracture, left ankle, subsequent encounter for fracture with routine healing: Secondary | ICD-10-CM | POA: Diagnosis not present

## 2024-09-06 DIAGNOSIS — K5909 Other constipation: Secondary | ICD-10-CM | POA: Diagnosis not present

## 2024-09-06 DIAGNOSIS — M6281 Muscle weakness (generalized): Secondary | ICD-10-CM | POA: Diagnosis not present

## 2024-09-06 DIAGNOSIS — E559 Vitamin D deficiency, unspecified: Secondary | ICD-10-CM | POA: Diagnosis not present

## 2024-09-07 DIAGNOSIS — E039 Hypothyroidism, unspecified: Secondary | ICD-10-CM | POA: Diagnosis not present

## 2024-09-07 DIAGNOSIS — S82842D Displaced bimalleolar fracture of left lower leg, subsequent encounter for closed fracture with routine healing: Secondary | ICD-10-CM | POA: Diagnosis not present

## 2024-09-07 DIAGNOSIS — Z9181 History of falling: Secondary | ICD-10-CM | POA: Diagnosis not present

## 2024-09-07 DIAGNOSIS — S82832D Other fracture of upper and lower end of left fibula, subsequent encounter for closed fracture with routine healing: Secondary | ICD-10-CM | POA: Diagnosis not present

## 2024-09-09 DIAGNOSIS — R7303 Prediabetes: Secondary | ICD-10-CM | POA: Diagnosis not present

## 2024-09-09 DIAGNOSIS — B372 Candidiasis of skin and nail: Secondary | ICD-10-CM | POA: Diagnosis not present

## 2024-09-09 DIAGNOSIS — Z8781 Personal history of (healed) traumatic fracture: Secondary | ICD-10-CM | POA: Diagnosis not present

## 2024-09-09 DIAGNOSIS — S82842D Displaced bimalleolar fracture of left lower leg, subsequent encounter for closed fracture with routine healing: Secondary | ICD-10-CM | POA: Diagnosis not present

## 2024-09-09 DIAGNOSIS — E039 Hypothyroidism, unspecified: Secondary | ICD-10-CM | POA: Diagnosis not present

## 2024-09-09 DIAGNOSIS — R232 Flushing: Secondary | ICD-10-CM | POA: Diagnosis not present

## 2024-09-09 DIAGNOSIS — M85852 Other specified disorders of bone density and structure, left thigh: Secondary | ICD-10-CM | POA: Diagnosis not present

## 2024-09-09 DIAGNOSIS — Z862 Personal history of diseases of the blood and blood-forming organs and certain disorders involving the immune mechanism: Secondary | ICD-10-CM | POA: Diagnosis not present

## 2024-09-09 DIAGNOSIS — S82832D Other fracture of upper and lower end of left fibula, subsequent encounter for closed fracture with routine healing: Secondary | ICD-10-CM | POA: Diagnosis not present

## 2024-09-09 DIAGNOSIS — R5381 Other malaise: Secondary | ICD-10-CM | POA: Diagnosis not present

## 2024-09-09 DIAGNOSIS — Z9181 History of falling: Secondary | ICD-10-CM | POA: Diagnosis not present

## 2024-09-13 DIAGNOSIS — Z9181 History of falling: Secondary | ICD-10-CM | POA: Diagnosis not present

## 2024-09-13 DIAGNOSIS — E039 Hypothyroidism, unspecified: Secondary | ICD-10-CM | POA: Diagnosis not present

## 2024-09-13 DIAGNOSIS — S82842D Displaced bimalleolar fracture of left lower leg, subsequent encounter for closed fracture with routine healing: Secondary | ICD-10-CM | POA: Diagnosis not present

## 2024-09-13 DIAGNOSIS — S82832D Other fracture of upper and lower end of left fibula, subsequent encounter for closed fracture with routine healing: Secondary | ICD-10-CM | POA: Diagnosis not present

## 2024-09-27 DIAGNOSIS — S82852D Displaced trimalleolar fracture of left lower leg, subsequent encounter for closed fracture with routine healing: Secondary | ICD-10-CM | POA: Diagnosis not present

## 2024-10-03 DIAGNOSIS — M6281 Muscle weakness (generalized): Secondary | ICD-10-CM | POA: Diagnosis not present

## 2024-10-03 DIAGNOSIS — R2689 Other abnormalities of gait and mobility: Secondary | ICD-10-CM | POA: Diagnosis not present

## 2024-10-03 DIAGNOSIS — S82892D Other fracture of left lower leg, subsequent encounter for closed fracture with routine healing: Secondary | ICD-10-CM | POA: Diagnosis not present

## 2024-10-10 DIAGNOSIS — R1031 Right lower quadrant pain: Secondary | ICD-10-CM | POA: Diagnosis not present

## 2025-01-07 ENCOUNTER — Other Ambulatory Visit

## 2025-02-02 ENCOUNTER — Other Ambulatory Visit (HOSPITAL_BASED_OUTPATIENT_CLINIC_OR_DEPARTMENT_OTHER)
